# Patient Record
Sex: Female | Born: 1995 | Race: Black or African American | Hispanic: No | Marital: Single | Smoking: Never smoker
Health system: Southern US, Community
[De-identification: ages and names within clinical notes are randomized; demographics above are authoritative.]

## PROBLEM LIST (undated history)

## (undated) DIAGNOSIS — F329 Major depressive disorder, single episode, unspecified: Secondary | ICD-10-CM

## (undated) DIAGNOSIS — D649 Anemia, unspecified: Secondary | ICD-10-CM

## (undated) DIAGNOSIS — F32A Depression, unspecified: Secondary | ICD-10-CM

## (undated) DIAGNOSIS — F419 Anxiety disorder, unspecified: Secondary | ICD-10-CM

## (undated) DIAGNOSIS — F319 Bipolar disorder, unspecified: Secondary | ICD-10-CM

---

## 2017-01-02 ENCOUNTER — Encounter (HOSPITAL_COMMUNITY): Payer: Self-pay | Admitting: Emergency Medicine

## 2017-01-02 ENCOUNTER — Emergency Department (HOSPITAL_COMMUNITY)
Admission: EM | Admit: 2017-01-02 | Discharge: 2017-01-02 | Disposition: A | Discharge status: Home / Self Care | Payer: 59 | Attending: Emergency Medicine | Admitting: Emergency Medicine

## 2017-01-02 DIAGNOSIS — R21 Rash and other nonspecific skin eruption: Secondary | ICD-10-CM | POA: Diagnosis not present

## 2017-01-02 DIAGNOSIS — T781XXA Other adverse food reactions, not elsewhere classified, initial encounter: Secondary | ICD-10-CM | POA: Diagnosis not present

## 2017-01-02 DIAGNOSIS — Z79899 Other long term (current) drug therapy: Secondary | ICD-10-CM | POA: Diagnosis not present

## 2017-01-02 HISTORY — DX: Anxiety disorder, unspecified: F41.9

## 2017-01-02 HISTORY — DX: Bipolar disorder, unspecified: F31.9

## 2017-01-02 HISTORY — DX: Anemia, unspecified: D64.9

## 2017-01-02 HISTORY — DX: Major depressive disorder, single episode, unspecified: F32.9

## 2017-01-02 HISTORY — DX: Depression, unspecified: F32.A

## 2017-01-02 MED ORDER — PREDNISONE 10 MG (21) PO TBPK: ORAL_TABLET | ORAL | 0 refills | Status: DC

## 2017-01-02 MED ORDER — SODIUM CHLORIDE 0.9 % IV BOLUS (SEPSIS)
1000.0000 mL | Freq: Once | INTRAVENOUS | Status: AC
  Administered 2017-01-02: 1000 mL via INTRAVENOUS

## 2017-01-02 MED ORDER — IPRATROPIUM-ALBUTEROL 0.5-2.5 (3) MG/3ML IN SOLN
3.0000 mL | Freq: Once | RESPIRATORY_TRACT | Status: AC
  Administered 2017-01-02: 3 mL via RESPIRATORY_TRACT
  Filled 2017-01-02: qty 3

## 2017-01-02 MED ORDER — FAMOTIDINE IN NACL 20-0.9 MG/50ML-% IV SOLN
20.0000 mg | Freq: Once | INTRAVENOUS | Status: AC
Start: 2017-01-02 — End: 2017-01-02
  Administered 2017-01-02: 20 mg via INTRAVENOUS
  Filled 2017-01-02: qty 50

## 2017-01-02 MED ORDER — ONDANSETRON HCL 4 MG/2ML IJ SOLN
4.0000 mg | Freq: Once | INTRAMUSCULAR | Status: DC
  Filled 2017-01-02: qty 2

## 2017-01-02 MED ORDER — METHYLPREDNISOLONE SODIUM SUCC 125 MG IJ SOLR
125.0000 mg | Freq: Once | INTRAMUSCULAR | Status: AC
  Administered 2017-01-02: 125 mg via INTRAVENOUS
  Filled 2017-01-02: qty 2

## 2017-01-02 MED ORDER — DIPHENHYDRAMINE HCL 50 MG/ML IJ SOLN
25.0000 mg | Freq: Once | INTRAMUSCULAR | Status: AC
  Administered 2017-01-02: 25 mg via INTRAVENOUS
  Filled 2017-01-02: qty 1

## 2017-01-02 NOTE — Discharge Instructions (Signed)
Stop current prednisone dose.  Start new dose.  OTC Eucerin cream for dry skin.

## 2017-01-02 NOTE — ED Provider Notes (Signed)
[facility]-EMERGENCY DEPT Provider Note   CSN: [id number] Arrival date & time: 01/02/17  1653     History   Chief Complaint Chief Complaint  Patient presents with  . Allergic Reaction    HPI Tracie Stevens is a [age] y.o. female.  Pt presents to the ED today with allergy sx.  She has a hx of peanut allergies and accidentally ate peanuts on Friday the 23rd.  [facility]one (20 mg) and a rx for epi pen.  Pt felt worse today.  She was too nervous to take the epi pen, so she came to the ED.  Pt c/o itching throat swelling.      Past Medical History:  Diagnosis Date  . Anemia   . Anxiety   . Bipolar 1 disorder (HCC)   . Depression     There are no active problems to display for this patient.   History reviewed. No pertinent surgical history.  OB History    No data available       Home Medications    Prior to Admission medications   Medication Sig Start Date End Date Taking? Authorizing Provider  ARIPiprazole (ABILIFY) 2 MG tablet Take 2 mg by mouth daily. 12/28/16  Yes Historical Provider, MD  diphenhydrAMINE (BENADRYL) 25 mg capsule Take 25 mg by mouth every 6 (six) hours as needed for allergies.   Yes Historical Provider, MD  EPINEPHrine 0.3 mg/0.3 mL IJ SOAJ injection Inject 0.3 mg into the muscle as needed. 01/02/17  Yes Historical Provider, MD  hydrOXYzine (ATARAX/VISTARIL) 50 MG tablet Take 25 mg by mouth daily as needed.  10/06/16  Yes Historical Provider, MD  PRAZOSIN HCL PO Take 1 tablet by mouth at bedtime.   Yes Historical Provider, MD  VENTOLIN HFA 108 (90 Base) MCG/ACT inhaler Take 2 puffs by mouth as needed. 12/14/16  Yes Historical Provider, MD  VIIBRYD 40 MG TABS Take 40 mg by mouth at bedtime. 12/06/16  Yes Historical Provider, MD  YASMIN 28 3-0.03 MG tablet Take 1 tablet by mouth daily. 10/12/16  Yes Historical Provider, MD  predniSONE (STERAPRED UNI-PAK 21 TAB) 10 MG (21) TBPK tablet Take 6 tabs by mouth daily  for  2 days, then 5 tabs for 2 days, then 4 tabs for 2 days, then 3 tabs for 2 days, 2 tabs for 2 days, then 1 tab by mouth daily for 2 days 01/02/17   Jacalyn LefevreJulie Arthea Nobel, MD    Family History History reviewed. No pertinent family history.  Social History Social History  Substance Use Topics  . Smoking status: Never Smoker  . Smokeless tobacco: Never Used  . Alcohol use No     Allergies   Other   Review of Systems Review of Systems  Skin: Positive for rash.  Allergic/Immunologic: Positive for food allergies.  All other systems reviewed and are negative.    Physical Exam Updated Vital Signs BP 125/63   Pulse 112   Temp 98.8 F (37.1 C) (Oral)   Resp 24   Ht 5\' 4"  (1.626 m)   Wt 160 lb (72.6 kg)   SpO2 100%   BMI 27.46 kg/m   Physical Exam  Constitutional: She is oriented to person, place, and time. She appears well-developed and well-nourished.  HENT:  Head: Normocephalic and atraumatic.  Right Ear: External ear normal.  Left Ear: External ear normal.  Nose: Nose normal.  Mouth/Throat: Oropharynx is clear and moist.  Eyes: EOM are normal.  Pupils are equal, round, and reactive to light.  Neck: Normal range of motion. Neck supple.  Cardiovascular: Regular rhythm, normal heart sounds and intact distal pulses.  Tachycardia present.   Pulmonary/Chest: Effort normal and breath sounds normal.  Abdominal: Soft. Bowel sounds are normal.  Musculoskeletal: Normal range of motion.  Neurological: She is alert and oriented to person, place, and time.  Skin: Rash noted.  Psychiatric: She has a normal mood and affect. Her behavior is normal. Judgment and thought content normal.  Nursing note and vitals reviewed.    ED Treatments / Results  Labs (all labs ordered are listed, but only abnormal results are displayed) Labs Reviewed - No data to display  EKG  EKG Interpretation None       Radiology No results found.  Procedures Procedures (including critical care  time)  Medications Ordered in ED Medications  ondansetron (ZOFRAN) injection 4 mg (0 mg Intravenous Hold 01/02/17 1945)  ipratropium-albuterol (DUONEB) 0.5-2.5 (3) MG/3ML nebulizer solution 3 mL (3 mLs Nebulization Given 01/02/17 1900)  methylPREDNISolone sodium succinate (SOLU-MEDROL) 125 mg/2 mL injection 125 mg (125 mg Intravenous Given 01/02/17 1900)  diphenhydrAMINE (BENADRYL) injection 25 mg (25 mg Intravenous Given 01/02/17 1900)  famotidine (PEPCID) IVPB 20 mg premix (0 mg Intravenous Stopped 01/02/17 1959)  sodium chloride 0.9 % bolus 1,000 mL (0 mLs Intravenous Stopped 01/02/17 2127)     Initial Impression / Assessment and Plan / ED Course  I have reviewed the triage vital signs and the nursing notes.  Pertinent labs & imaging results that were available during my care of the patient were reviewed by me and considered in my medical decision making (see chart for details).    Pt feels much better and rash is gone after treatment with solumedrol, pepcid, and benadryl.  Pt d/c with a stronger dose and longer taper of prednisone.  Pt does have her epi pen.  She knows to return if worse.   Final Clinical Impressions(s) / ED Diagnoses   Final diagnoses:  Allergic reaction to food, initial encounter    New Prescriptions Discharge Medication List as of 01/02/2017  9:30 PM    START taking these medications   Details  predniSONE (STERAPRED UNI-PAK 21 TAB) 10 MG (21) TBPK tablet Take 6 tabs by mouth daily  for 2 days, then 5 tabs for 2 days, then 4 tabs for 2 days, then 3 tabs for 2 days, 2 tabs for 2 days, then 1 tab by mouth daily for 2 days, Print         Jacalyn Lefevre, MD 01/02/17 2333

## 2017-01-02 NOTE — ED Notes (Signed)
Bed: WA22 Expected date:  Expected time:  Means of arrival:  Comments: Hold for triage 

## 2017-01-02 NOTE — ED Triage Notes (Signed)
Pt reports she had an allergic reaction on Friday to peanuts. Pt reports she began to feel worse today. [facility] Pt took and nap and when she woke up she noticed worsening SOB and abd pain. Pt feels that throat is swollen.

## 2017-01-03 DIAGNOSIS — R109 Unspecified abdominal pain: Secondary | ICD-10-CM | POA: Diagnosis present

## 2017-01-03 DIAGNOSIS — T380X5A Adverse effect of glucocorticoids and synthetic analogues, initial encounter: Secondary | ICD-10-CM | POA: Insufficient documentation

## 2017-01-03 DIAGNOSIS — Z79899 Other long term (current) drug therapy: Secondary | ICD-10-CM | POA: Diagnosis not present

## 2017-01-03 DIAGNOSIS — F419 Anxiety disorder, unspecified: Secondary | ICD-10-CM | POA: Diagnosis not present

## 2017-01-03 DIAGNOSIS — K2970 Gastritis, unspecified, without bleeding: Secondary | ICD-10-CM | POA: Insufficient documentation

## 2017-01-03 NOTE — ED Triage Notes (Signed)
Pt states that this afternoon she started feeling like she was manic and tried to take her meds but feels more anxious now. Pt is also taking prednisone for an allergic reaction to peanuts yesterday. Pt states now she feels depressed and sad. Alert and oriented.Denies SI/HI.

## 2017-01-04 ENCOUNTER — Emergency Department (HOSPITAL_COMMUNITY): Payer: 59

## 2017-01-04 ENCOUNTER — Encounter: Payer: Self-pay | Admitting: Emergency Medicine

## 2017-01-04 ENCOUNTER — Emergency Department (HOSPITAL_COMMUNITY)
Admission: EM | Admit: 2017-01-04 | Discharge: 2017-01-04 | Disposition: A | Discharge status: Home / Self Care | Payer: 59 | Attending: Emergency Medicine | Admitting: Emergency Medicine

## 2017-01-04 DIAGNOSIS — K2970 Gastritis, unspecified, without bleeding: Secondary | ICD-10-CM

## 2017-01-04 DIAGNOSIS — R1013 Epigastric pain: Secondary | ICD-10-CM

## 2017-01-04 DIAGNOSIS — R0789 Other chest pain: Secondary | ICD-10-CM

## 2017-01-04 DIAGNOSIS — F419 Anxiety disorder, unspecified: Secondary | ICD-10-CM

## 2017-01-04 DIAGNOSIS — R112 Nausea with vomiting, unspecified: Secondary | ICD-10-CM

## 2017-01-04 DIAGNOSIS — T50905A Adverse effect of unspecified drugs, medicaments and biological substances, initial encounter: Secondary | ICD-10-CM

## 2017-01-04 LAB — URINALYSIS, ROUTINE W REFLEX MICROSCOPIC
BILIRUBIN URINE: NEGATIVE
GLUCOSE, UA: NEGATIVE mg/dL
HGB URINE DIPSTICK: NEGATIVE
Ketones, ur: NEGATIVE mg/dL
Leukocytes, UA: NEGATIVE
Nitrite: NEGATIVE
PROTEIN: NEGATIVE mg/dL
Specific Gravity, Urine: 1.013 (ref 1.005–1.030)
pH: 6 (ref 5.0–8.0)

## 2017-01-04 LAB — COMPREHENSIVE METABOLIC PANEL
ALT: 32 U/L (ref 14–54)
ANION GAP: 9 (ref 5–15)
AST: 34 U/L (ref 15–41)
Albumin: 4.4 g/dL (ref 3.5–5.0)
Alkaline Phosphatase: 76 U/L (ref 38–126)
BILIRUBIN TOTAL: 0.5 mg/dL (ref 0.3–1.2)
BUN: 15 mg/dL (ref 6–20)
CO2: 22 mmol/L (ref 22–32)
Calcium: 9.2 mg/dL (ref 8.9–10.3)
Chloride: 107 mmol/L (ref 101–111)
Creatinine, Ser: 0.91 mg/dL (ref 0.44–1.00)
GFR calc Af Amer: >60 mL/min (ref >60)
Glucose, Bld: 126 mg/dL — ABNORMAL HIGH (ref 65–99)
POTASSIUM: 4.6 mmol/L (ref 3.5–5.1)
Sodium: 138 mmol/L (ref 135–145)
TOTAL PROTEIN: 7.7 g/dL (ref 6.5–8.1)

## 2017-01-04 LAB — CBC WITH DIFFERENTIAL/PLATELET
BASOS ABS: 0 10*3/uL (ref 0.0–0.1)
Basophils Relative: 0 %
Eosinophils Absolute: 0 10*3/uL (ref 0.0–0.7)
Eosinophils Relative: 0 %
HEMATOCRIT: 37.5 % (ref 36.0–46.0)
Hemoglobin: 12.8 g/dL (ref 12.0–15.0)
LYMPHS ABS: 1.2 10*3/uL (ref 0.7–4.0)
Lymphocytes Relative: 8 %
MCH: 28.9 pg (ref 26.0–34.0)
MCHC: 34.1 g/dL (ref 30.0–36.0)
MCV: 84.7 fL (ref 78.0–100.0)
MONOS PCT: 4 %
Monocytes Absolute: 0.6 10*3/uL (ref 0.1–1.0)
NEUTROS ABS: 13.2 10*3/uL — AB (ref 1.7–7.7)
Neutrophils Relative %: 88 %
Platelets: 381 10*3/uL (ref 150–400)
RBC: 4.43 MIL/uL (ref 3.87–5.11)
RDW: 12.9 % (ref 11.5–15.5)
WBC: 15 10*3/uL — ABNORMAL HIGH (ref 4.0–10.5)

## 2017-01-04 LAB — I-STAT BETA HCG BLOOD, ED (MC, WL, AP ONLY)

## 2017-01-04 LAB — LIPASE, BLOOD: LIPASE: 19 U/L (ref 11–51)

## 2017-01-04 LAB — I-STAT TROPONIN, ED: Troponin i, poc: 0 ng/mL (ref 0.00–0.08)

## 2017-01-04 MED ORDER — SODIUM CHLORIDE 0.9 % IV BOLUS (SEPSIS)
1000.0000 mL | Freq: Once | INTRAVENOUS | Status: AC
  Administered 2017-01-04: 1000 mL via INTRAVENOUS

## 2017-01-04 MED ORDER — ONDANSETRON 4 MG PO TBDP: 4.0000 mg | ORAL_TABLET | Freq: Three times a day (TID) | ORAL | 0 refills | Status: DC | PRN

## 2017-01-04 MED ORDER — ONDANSETRON HCL 4 MG/2ML IJ SOLN
4.0000 mg | Freq: Once | INTRAMUSCULAR | Status: AC
  Administered 2017-01-04: 4 mg via INTRAVENOUS
  Filled 2017-01-04: qty 2

## 2017-01-04 MED ORDER — GI COCKTAIL ~~LOC~~
30.0000 mL | Freq: Once | ORAL | Status: AC
  Administered 2017-01-04: 30 mL via ORAL
  Filled 2017-01-04: qty 30

## 2017-01-04 NOTE — ED Notes (Signed)
Asked for urine  

## 2017-01-04 NOTE — ED Provider Notes (Signed)
[facility]-EMERGENCY DEPT Provider Note   CSN: [id number] Arrival date & time: 01/03/17  2350  By signing my name below, I, Rosario Adie, attest that this documentation has been prepared under the direction and in the presence of [address], [employer]. Electronically Signed: Rosario Adie, ED Scribe. 01/04/17. 1:25 AM.  History   Chief Complaint Chief Complaint  Patient presents with  . Anxiety   The history is provided by the patient and medical records. No language interpreter was used.  Anxiety  This is a recurrent problem. The current episode started 3 to 5 hours ago. The problem occurs constantly. The problem has been gradually worsening. Associated symptoms include abdominal pain. Pertinent negatives include no chest pain and no shortness of breath. Exacerbated by: Prednisone. Nothing relieves the symptoms. She has tried nothing for the symptoms. The treatment provided no relief.    HPI Comments: Tracie Stevens is a 21 y.o. female brought in by EMS, with a PMHx of anxiety, Bipolar 1 disorder, and depression, who presents to the Emergency Department complaining of gradually worsening sensation of "feeling manic" beginning four hours ago. Per prior chart review, pt was seen in the ED yesterday for a mild allergic reaction. She was given a prescription for Prednisone at that time, and she states that she took her first dose of it earlier this afternoon at [facility]; reports that she was already feeling somewhat manic at the time, secondary to her bipolar disorder. She states that following taking her prednisone, she started feeling worse manic symptoms; proceeded with taking her nightly Abilify and Viibryd however this didn't help her manic feelings and so she became more anxious and called EMS. She describes her symptoms as sensation of jitteriness and feeling energetic and accelerated. Pt also notes associated gradual onset epigastric abdominal pain since awakening this morning, which she  originally thought was because she hadn't eaten and was hungry; however she continues to feel it, and states she didn't really eat much today even though she thought she needed to eat. She describes the pain as 5/10, intermittent sharp epigastric pain with radiation into her right lateral chest, worsened with moving, and with no tx tried PTA. She also reports she had three episodes of non-bloody, non-bilious vomiting earlier. She denies any personal hx of PE/DVT, recent long travel, surgery, prolonged immobilization, or any estrogen/hormone use. She denies SI/HI, auditory/visual hallucinations, intentional overdoses or self-injury, illicit drug or alcohol usage. She also denies diaphoresis, lightheadedness, fevers, chills, SOB, cough, LE swelling, claudication, orthopnea, diarrhea, constipation, obstipation, melena, hematochezia, hematemesis, hematuria, dysuria, vaginal bleeding/discharge, myalgias, arthralgias, numbness, tingling, focal weakness, or any other complaints at this time. No family hx of cardiac disease. Nonsmoker.   Past Medical History:  Diagnosis Date  . Anemia   . Anxiety   . Bipolar 1 disorder (HCC)   . Depression    There are no active problems to display for this patient.  No past surgical history on file.  OB History    No data available       Home Medications    Prior to Admission medications   Medication Sig Start Date End Date Taking? Authorizing Provider  ARIPiprazole (ABILIFY) 2 MG tablet Take 2 mg by mouth daily. 12/28/16   Historical Provider, MD  diphenhydrAMINE (BENADRYL) 25 mg capsule Take 25 mg by mouth every 6 (six) hours as needed for allergies.    Historical Provider, MD  EPINEPHrine 0.3 mg/0.3 mL IJ SOAJ injection Inject 0.3 mg into the muscle as needed. 01/02/17  Historical Provider, MD  hydrOXYzine (ATARAX/VISTARIL) 50 MG tablet Take 25 mg by mouth daily as needed.  10/06/16   Historical Provider, MD  PRAZOSIN HCL PO Take 1 tablet by mouth at bedtime.     Historical Provider, MD  predniSONE (STERAPRED UNI-PAK 21 TAB) 10 MG (21) TBPK tablet Take 6 tabs by mouth daily  for 2 days, then 5 tabs for 2 days, then 4 tabs for 2 days, then 3 tabs for 2 days, 2 tabs for 2 days, then 1 tab by mouth daily for 2 days 01/02/17   Jacalyn Lefevre, MD  VENTOLIN HFA 108 [phone] Base) MCG/ACT inhaler Take 2 puffs by mouth as needed. 12/14/16   Historical Provider, MD  VIIBRYD 40 MG TABS Take 40 mg by mouth at bedtime. 12/06/16   Historical Provider, MD  YASMIN 28 3-0.03 MG tablet Take 1 tablet by mouth daily. 10/12/16   Historical Provider, MD    Family History No family history on file.  Social History Social History  Substance Use Topics  . Smoking status: Never Smoker  . Smokeless tobacco: Never Used  . Alcohol use No   Allergies   Other and Peach flavor  Review of Systems Review of Systems  Constitutional: Negative for chills, diaphoresis and fever.  Respiratory: Negative for cough and shortness of breath.   Cardiovascular: Negative for chest pain and leg swelling.  Gastrointestinal: Positive for abdominal pain, nausea and vomiting. Negative for blood in stool, constipation and diarrhea.  Genitourinary: Negative for dysuria, hematuria, vaginal bleeding and vaginal discharge.  Musculoskeletal: Negative for arthralgias and myalgias.  Skin: Negative for color change.  Allergic/Immunologic: Negative for immunocompromised state.  Neurological: Negative for weakness, light-headedness and numbness.  Psychiatric/Behavioral: Negative for confusion, self-injury and suicidal ideas. The patient is nervous/anxious.        +mania, No HI.   A complete 10 system review of systems was obtained and all systems are negative except as noted in the HPI and PMH.   Physical Exam Updated Vital Signs BP 117/93 (BP Location: Right Arm)   Pulse 93   Temp 98.4 F (36.9 C) (Oral)   Resp 20   LMP 01/04/2017 (Approximate)   SpO2 97%   Physical Exam  Constitutional: She is  oriented to person, place, and time. Vital signs are normal. She appears well-developed and well-nourished.  Non-toxic appearance. She appears distressed (anxious).  Afebrile, nontoxic, moderately anxious appearing.   HENT:  Head: Normocephalic and atraumatic.  Mouth/Throat: Oropharynx is clear and moist and mucous membranes are normal.  Eyes: Conjunctivae and EOM are normal. Right eye exhibits no discharge. Left eye exhibits no discharge.  Neck: Normal range of motion. Neck supple.  Cardiovascular: Normal rate, regular rhythm, normal heart sounds and intact distal pulses.  Exam reveals no gallop and no friction rub.   No murmur heard. RRR, nl s1/s2, no m/r/g, distal pulses intact, no pedal edema  Pulmonary/Chest: Effort normal and breath sounds normal. No respiratory distress. She has no decreased breath sounds. She has no wheezes. She has no rhonchi. She has no rales. She exhibits tenderness. She exhibits no crepitus, no deformity and no retraction.  CTAB in all lung fields, no w/r/r, no hypoxia or increased WOB, speaking in full sentences, SpO2 97% on RA Chest wall with mild epigastric/subxyphoid TTP without crepitus, deformities, or retractions   Abdominal: Soft. Normal appearance and bowel sounds are normal. She exhibits no distension. There is tenderness in the epigastric area. There is no rigidity, no rebound, no guarding,  no CVA tenderness, no tenderness at McBurney's point and negative Murphy's sign.  Soft, non-distended, +BS throughout, with mild epigastric TTP, no r/g/r, neg murphy's, neg mcburney's, no CVA TTP  Musculoskeletal: Normal range of motion.  MAE x4 Strength and sensation grossly intact in all extremities Distal pulses intact Gait steady No pedal edema, neg homan's bilaterally  Neurological: She is alert and oriented to person, place, and time. She has normal strength. No sensory deficit.  Skin: Skin is warm, dry and intact. No rash noted.  Psychiatric: Her mood appears  anxious. Her speech is rapid and/or pressured. She is not actively hallucinating. She expresses no homicidal and no suicidal ideation. She expresses no suicidal plans and no homicidal plans.  Anxious appearing, somewhat rapid and pressured speech, denies SI/HI/AVH.   Nursing note and vitals reviewed.  ED Treatments / Results  DIAGNOSTIC STUDIES: Oxygen Saturation is 97% on RA, normal by my interpretation.   COORDINATION OF CARE: 1:49 AM-Discussed next steps with pt. Pt verbalized understanding and is agreeable with the plan.   Labs (all labs ordered are listed, but only abnormal results are displayed) Labs Reviewed  CBC WITH DIFFERENTIAL/PLATELET - Abnormal; Notable for the following:       Result Value   WBC 15.0 (*)    Neutro Abs 13.2 (*)    All other components within normal limits  COMPREHENSIVE METABOLIC PANEL - Abnormal; Notable for the following:    Glucose, Bld 126 (*)    All other components within normal limits  URINALYSIS, ROUTINE W REFLEX MICROSCOPIC - Abnormal; Notable for the following:    Color, Urine STRAW (*)    All other components within normal limits  LIPASE, BLOOD  I-STAT TROPOININ, ED  I-STAT BETA HCG BLOOD, ED ([facility], [facility], [facility] ONLY)    EKG  EKG Interpretation  Date/Time:  Wednesday January 04 2017 02:39:46 EST Ventricular Rate:  92 PR Interval:    QRS Duration: 80 QT Interval:  357 QTC Calculation: 442 R Axis:   66 Text Interpretation:  Sinus rhythm No old tracing to compare Confirmed by Erroll Luna [phone]) on 01/04/2017 4:15:29 AM      Radiology Dg Chest 2 View  Result Date: 01/04/2017 CLINICAL DATA:  Chest pain. EXAM: CHEST  2 VIEW COMPARISON:  None. FINDINGS: The cardiomediastinal contours are normal. The lungs are clear. Pulmonary vasculature is normal. No consolidation, pleural effusion, or pneumothorax. No acute osseous abnormalities are seen. IMPRESSION: No active cardiopulmonary disease. Electronically Signed   By: Rubye Oaks  M.D.   On: 01/04/2017 02:53    Procedures Procedures   Medications Ordered in ED Medications  sodium chloride 0.9 % bolus 1,000 mL (0 mLs Intravenous Stopped 01/04/17 0348)  gi cocktail (Maalox,Lidocaine,Donnatal) (30 mLs Oral Given 01/04/17 0221)  ondansetron (ZOFRAN) injection 4 mg (4 mg Intravenous Given 01/04/17 0222)    Initial Impression / Assessment and Plan / ED Course  I have reviewed the triage vital signs and the nursing notes.  Pertinent labs & imaging results that were available during my care of the patient were reviewed by me and considered in my medical decision making (see chart for details).     21 y.o. female here with anxiety/feeling manic after taking her prednisone at 4pm; states she was already feeling manic before that, has bipolar so she states this is somewhat normal for her; and after prednisone it amplified her symptoms. She reports she developed some epigastric pain that radiates into her chest which she attributed to not eating  well today; also had some n/v. On exam, appears moderately anxious, however VSS and otherwise in NAD; mild epigastric TTP which reproduces the CP she states she's also having; nonperitoneal and neg murphy's. No LE swelling, tachycardia, hypoxia, and PERC neg. Doubt PE, dissection, or other emergent cardiopulmonary etiologies of her symptoms. Overall her symptoms seem consistent with side effects of prednisone, especially since she took it in the afternoon which isn't ideal. Will get CBC w/diff, CMP, lipase, U/A, betaHCG, EKG, troponin, and CXR; will give GI cocktail, fluids, and zofran then reassess shortly.   6:03 AM CBC w/diff mildly elevated leukocytosis however differential unremarkable, could be stress demargination. CMP WNL aside from gluc 126. Lipase WNL. BetaHCG neg. U/A unremarkable. Trop neg. EKG unremarkable with no acute ischemic findings. CXR negative. Pt feeling much better, symptoms resolved, and tolerating PO well. Symptoms  likely due to prednisone side effects, coupled with bipolar disorder related mania; however pt not acutely psychotic and does not appear to be a threat to herself or others; low HEART score, and chest pain is likely more GI related since it's mostly in the epigastric region. Advised that she take prednisone with breakfast, not later, to lessen the side effects. Take usual home meds. Doubt need for psych eval given that she doesn't represent a threat to herself or others. Use tylenol/motrin for pain, rx zofran given. Advised tums/maalox PRN for additional relief, and GERD/gastritis diet modifications advised. F/up with PCP in 1wk for recheck of symptoms and ongoing management of her symptoms and psych conditions. I explained the diagnosis and have given explicit precautions to return to the ER including for any other new or worsening symptoms. The patient understands and accepts the medical plan as it's been dictated and I have answered their questions. Discharge instructions concerning home care and prescriptions have been given. The patient is STABLE and is discharged to home in good condition.   I personally performed the services described in this documentation, which was scribed in my presence. The recorded information has been reviewed and is accurate.   Final Clinical Impressions(s) / ED Diagnoses   Final diagnoses:  Anxiety  Atypical chest pain  Epigastric abdominal pain  Gastritis, presence of bleeding unspecified, unspecified chronicity, unspecified gastritis type  Nausea and vomiting in adult patient  Adverse effect of drug, initial encounter   New Prescriptions New Prescriptions   ONDANSETRON (ZOFRAN ODT) 4 MG DISINTEGRATING TABLET    Take 1 tablet (4 mg total) by mouth every 8 (eight) hours as needed for nausea or vomiting.     [address]Canary Fister, PA-C 01/04/17 [id number]    Tomasita CrumbleAdeleke Oni, MD 01/04/17 [phone]

## 2017-01-04 NOTE — Discharge Instructions (Signed)
Your abdominal/chest pain is likely from gastritis or an ulcer, or could be related to stress/anxiety. Your manic symptoms were likely worsened by taking prednisone in the afternoon; you should ALWAYS take it with BREAKFAST in order to lessen the side effects. You can consider trying to use over the counter zantac as needed for symptom relief, and avoid spicy/fatty/acidic foods, avoid soda/coffee/tea/alcohol. Avoid laying down flat within 30 minutes of eating. Avoid NSAIDs like ibuprofen/aleve/motrin/etc on an empty stomach. May consider using over the counter tums/maalox as needed for additional relief. Use zofran as directed as needed for nausea. Use tylenol as needed for pain. Follow up with your regular doctor in one week for ongoing evaluation and recheck of your symptoms and management of your bipolar disorder. Return to the ER for changes or worsening symptoms.  Abdominal (belly) pain can be caused by many things. Your caregiver performed an examination and possibly ordered blood/urine tests and imaging (CT scan, x-rays, ultrasound). Many cases can be observed and treated at home after initial evaluation in the emergency department. Even though you are being discharged home, abdominal pain can be unpredictable. Therefore, you need a repeated exam if your pain does not resolve, returns, or worsens. Most patients with abdominal pain don't have to be admitted to the hospital or have surgery, but serious problems like appendicitis and gallbladder attacks can start out as nonspecific pain. Many abdominal conditions cannot be diagnosed in one visit, so follow-up evaluations are very important. SEEK IMMEDIATE MEDICAL ATTENTION IF YOU DEVELOP ANY OF THE FOLLOWING SYMPTOMS: The pain does not go away or becomes severe.  A temperature above 101 develops.  Repeated vomiting occurs (multiple episodes).  The pain becomes localized to portions of the abdomen. The right side could possibly be appendicitis. In an  adult, the left lower portion of the abdomen could be colitis or diverticulitis.  Blood is being passed in stools or vomit (bright red or black tarry stools).  Return also if you develop chest pain, difficulty breathing, dizziness or fainting, or become confused, poorly responsive, or inconsolable (young children). The constipation stays for more than 4 days.  There is belly (abdominal) or rectal pain.  You do not seem to be getting better.

## 2017-01-04 NOTE — ED Notes (Signed)
Pt in xray

## 2017-02-11 ENCOUNTER — Emergency Department (HOSPITAL_COMMUNITY)
Admission: EM | Admit: 2017-02-11 | Discharge: 2017-02-11 | Disposition: A | Discharge status: Home / Self Care | Payer: 59 | Attending: Emergency Medicine | Admitting: Emergency Medicine

## 2017-02-11 ENCOUNTER — Emergency Department (HOSPITAL_COMMUNITY): Payer: 59

## 2017-02-11 ENCOUNTER — Encounter (HOSPITAL_COMMUNITY): Payer: Self-pay

## 2017-02-11 DIAGNOSIS — Z79899 Other long term (current) drug therapy: Secondary | ICD-10-CM | POA: Diagnosis not present

## 2017-02-11 DIAGNOSIS — F419 Anxiety disorder, unspecified: Secondary | ICD-10-CM | POA: Diagnosis not present

## 2017-02-11 DIAGNOSIS — R000 Tachycardia, unspecified: Secondary | ICD-10-CM | POA: Diagnosis not present

## 2017-02-11 DIAGNOSIS — F410 Panic disorder [episodic paroxysmal anxiety] without agoraphobia: Secondary | ICD-10-CM | POA: Diagnosis present

## 2017-02-11 LAB — RAPID URINE DRUG SCREEN, HOSP PERFORMED
Amphetamines: NOT DETECTED
Barbiturates: NOT DETECTED
Benzodiazepines: NOT DETECTED
Cocaine: NOT DETECTED
OPIATES: NOT DETECTED
Tetrahydrocannabinol: POSITIVE — AB

## 2017-02-11 LAB — CBC WITH DIFFERENTIAL/PLATELET
BASOS PCT: 0 %
Basophils Absolute: 0 10*3/uL (ref 0.0–0.1)
EOS ABS: 0 10*3/uL (ref 0.0–0.7)
EOS PCT: 0 %
HCT: 34.5 % — ABNORMAL LOW (ref 36.0–46.0)
Hemoglobin: 11.8 g/dL — ABNORMAL LOW (ref 12.0–15.0)
LYMPHS ABS: 1 10*3/uL (ref 0.7–4.0)
Lymphocytes Relative: 9 %
MCH: 29.5 pg (ref 26.0–34.0)
MCHC: 34.2 g/dL (ref 30.0–36.0)
MCV: 86.3 fL (ref 78.0–100.0)
MONO ABS: 0.5 10*3/uL (ref 0.1–1.0)
Monocytes Relative: 4 %
Neutro Abs: 10.1 10*3/uL — ABNORMAL HIGH (ref 1.7–7.7)
Neutrophils Relative %: 87 %
Platelets: 289 10*3/uL (ref 150–400)
RBC: 4 MIL/uL (ref 3.87–5.11)
RDW: 13 % (ref 11.5–15.5)
WBC: 11.6 10*3/uL — ABNORMAL HIGH (ref 4.0–10.5)

## 2017-02-11 LAB — I-STAT CHEM 8, ED
BUN: 9 mg/dL (ref 6–20)
CREATININE: 0.7 mg/dL (ref 0.44–1.00)
Calcium, Ion: 1.1 mmol/L — ABNORMAL LOW (ref 1.15–1.40)
Chloride: 102 mmol/L (ref 101–111)
Glucose, Bld: 209 mg/dL — ABNORMAL HIGH (ref 65–99)
HEMATOCRIT: 38 % (ref 36.0–46.0)
HEMOGLOBIN: 12.9 g/dL (ref 12.0–15.0)
POTASSIUM: 3.1 mmol/L — AB (ref 3.5–5.1)
SODIUM: 140 mmol/L (ref 135–145)
TCO2: 24 mmol/L (ref 0–100)

## 2017-02-11 LAB — TSH: TSH: 2.302 u[IU]/mL (ref 0.350–4.500)

## 2017-02-11 MED ORDER — IOPAMIDOL (ISOVUE-370) INJECTION 76%
INTRAVENOUS | Status: AC
  Filled 2017-02-11: qty 100

## 2017-02-11 MED ORDER — POTASSIUM CHLORIDE CRYS ER 20 MEQ PO TBCR
40.0000 meq | EXTENDED_RELEASE_TABLET | Freq: Once | ORAL | Status: AC
  Administered 2017-02-11: 40 meq via ORAL
  Filled 2017-02-11: qty 2

## 2017-02-11 MED ORDER — SODIUM CHLORIDE 0.9 % IV SOLN: INTRAVENOUS | Status: DC

## 2017-02-11 MED ORDER — LORAZEPAM 2 MG/ML IJ SOLN
1.0000 mg | Freq: Once | INTRAMUSCULAR | Status: AC
  Administered 2017-02-11: 1 mg via INTRAVENOUS
  Filled 2017-02-11: qty 1

## 2017-02-11 MED ORDER — SODIUM CHLORIDE 0.9 % IV BOLUS (SEPSIS)
2000.0000 mL | Freq: Once | INTRAVENOUS | Status: AC
  Administered 2017-02-11: 2000 mL via INTRAVENOUS

## 2017-02-11 MED ORDER — IOPAMIDOL (ISOVUE-370) INJECTION 76%
100.0000 mL | Freq: Once | INTRAVENOUS | Status: AC | PRN
  Administered 2017-02-11: 100 mL via INTRAVENOUS

## 2017-02-11 NOTE — ED Triage Notes (Addendum)
BIB EMS from [facility] reports sob after eating [employer], pt initially though it was an allergic reaction but now believes it was a panic attack r/t to the increase in her psychiatric medications. Pt denies pain. Pt denies SI/HI.

## 2017-02-11 NOTE — Discharge Instructions (Signed)
You were offered admission for evaluation of your anxiety as well as increased heart rate. You have deferred at this time and encouraged to return should you change your mind

## 2017-02-11 NOTE — ED Notes (Signed)
EKG given to EDP,Allen,MD., for pain.

## 2017-02-11 NOTE — ED Provider Notes (Signed)
[facility]-EMERGENCY DEPT Provider Note   CSN: [id number] Arrival date & time: 02/11/17  0407     History   Chief Complaint Chief Complaint  Patient presents with  . Panic Attack    HPI Tracie Stevens is a 21 y.o. female.  21 year old female presents after waking from her sleep with a night tear. Patient states that she has had increased anxiety over the last few days due to increased stress in her life. Does have a history of bipolar disorder. When she woke up she had nausea and had emesis 1. Denies any suicidal or homicidal ideations. Denies any recent history of volume loss. Denies illicit drug use. Called EMS and was transported here.      Past Medical History:  Diagnosis Date  . Anemia   . Anxiety   . Bipolar 1 disorder (HCC)   . Depression     There are no active problems to display for this patient.   History reviewed. No pertinent surgical history.  OB History    No data available       Home Medications    Prior to Admission medications   Medication Sig Start Date End Date Taking? Authorizing Provider  ARIPiprazole (ABILIFY) 2 MG tablet Take 2 mg by mouth daily. 12/28/16   Historical Provider, MD  diphenhydrAMINE (BENADRYL) 25 mg capsule Take 25 mg by mouth every 6 (six) hours as needed for allergies.    Historical Provider, MD  EPINEPHrine 0.3 mg/0.3 mL IJ SOAJ injection Inject 0.3 mg into the muscle as needed (allergic reaction).  01/02/17   Historical Provider, MD  hydrOXYzine (ATARAX/VISTARIL) 50 MG tablet Take 25 mg by mouth daily as needed for itching.  10/06/16   Historical Provider, MD  ondansetron (ZOFRAN ODT) 4 MG disintegrating tablet Take 1 tablet (4 mg total) by mouth every 8 (eight) hours as needed for nausea or vomiting. 01/04/17   Mercedes Street, PA-C  OVER THE COUNTER MEDICATION Take 1 tablet by mouth as directed. Before prednisone    Historical Provider, MD  PRAZOSIN HCL PO Take 1 mg by mouth at bedtime.     Historical Provider, MD  predniSONE  (STERAPRED UNI-PAK 21 TAB) 10 MG (21) TBPK tablet Take 6 tabs by mouth daily  for 2 days, then 5 tabs for 2 days, then 4 tabs for 2 days, then 3 tabs for 2 days, 2 tabs for 2 days, then 1 tab by mouth daily for 2 days 01/02/17   Jacalyn Lefevre, MD  VENTOLIN HFA 108 [id number] Base) MCG/ACT inhaler Take 2 puffs by mouth as needed for shortness of breath.  12/14/16   Historical Provider, MD  VIIBRYD 40 MG TABS Take 40 mg by mouth at bedtime. 12/06/16   Historical Provider, MD  YASMIN 28 3-0.03 MG tablet Take 1 tablet by mouth daily. 10/12/16   Historical Provider, MD    Family History History reviewed. No pertinent family history.  Social History Social History  Substance Use Topics  . Smoking status: Never Smoker  . Smokeless tobacco: Never Used  . Alcohol use No     Allergies   Other and Peach flavor   Review of Systems Review of Systems  All other systems reviewed and are negative.    Physical Exam Updated Vital Signs BP 133/79 (BP Location: Right Arm)   Pulse (!) 139   Temp 98.4 F (36.9 C) (Oral)   Resp 20   Ht  (1.626 m)   Wt 81.6 kg   LMP 02/10/2017  SpO2 100%   BMI 30.90 kg/m   Physical Exam  Constitutional: She is oriented to person, place, and time. She appears well-developed and well-nourished.  Non-toxic appearance. No distress.  HENT:  Head: Normocephalic and atraumatic.  Eyes: Conjunctivae, EOM and lids are normal. Pupils are equal, round, and reactive to light.  Neck: Normal range of motion. Neck supple. No tracheal deviation present. No thyroid mass present.  Cardiovascular: Regular rhythm and normal heart sounds.  Tachycardia present.  Exam reveals no gallop.   No murmur heard. Pulmonary/Chest: Effort normal and breath sounds normal. No stridor. No respiratory distress. She has no decreased breath sounds. She has no wheezes. She has no rhonchi. She has no rales.  Abdominal: Soft. Normal appearance and bowel sounds are normal. She exhibits no distension.  There is no tenderness. There is no rebound and no CVA tenderness.  Musculoskeletal: Normal range of motion. She exhibits no edema or tenderness.  Neurological: She is alert and oriented to person, place, and time. She has normal strength. No cranial nerve deficit or sensory deficit. GCS eye subscore is 4. GCS verbal subscore is 5. GCS motor subscore is 6.  Skin: Skin is warm and dry. No abrasion and no rash noted.  Psychiatric: Her speech is normal and behavior is normal. Her mood appears anxious.  Nursing note and vitals reviewed.    ED Treatments / Results  Labs (all labs ordered are listed, but only abnormal results are displayed) Labs Reviewed  I-STAT CHEM 8, ED    EKG  EKG Interpretation None       Radiology No results found.  Procedures Procedures (including critical care time)  Medications Ordered in ED Medications  0.9 %  sodium chloride infusion (not administered)  sodium chloride 0.9 % bolus 2,000 mL (not administered)  LORazepam (ATIVAN) injection 1 mg (not administered)     Initial Impression / Assessment and Plan / ED Course  I have reviewed the triage vital signs and the nursing notes.  Pertinent labs & imaging results that were available during my care of the patient were reviewed by me and considered in my medical decision making (see chart for details).     Patient given IV fluids as well as Ativan. She still remains tachycardic into the 130s and up to 150 when awake. She denies feeling anxious at this time. She relates that she did feel dyspneic when she first woke up and had some chest discomfort. Will obtain chest CT and other laboratory testing at this time.  6:49 AM Patient's chest CT negative for PE. Her TSH is within normal limits. Does have mild hypokalemia. That was treated with oral potassium. Her hemoglobin is stable. Patient is calm her heart rate goes down to the 110s but when speaking and somewhat anxious to goes up to the 130s to 140s. I  offered her admission for further evaluation of this but she has deferred. I feel that this is likely anxiety given her current workup. She denies any chest pain. At this time. Return precautions given.  Final Clinical Impressions(s) / ED Diagnoses   Final diagnoses:  None    New Prescriptions New Prescriptions   No medications on file     Lorre Nick, MD 02/11/17 [phone]

## 2017-02-18 ENCOUNTER — Ambulatory Visit: Payer: Self-pay

## 2017-03-10 ENCOUNTER — Encounter: Payer: Self-pay | Admitting: Family Medicine

## 2017-03-10 ENCOUNTER — Ambulatory Visit (INDEPENDENT_AMBULATORY_CARE_PROVIDER_SITE_OTHER): Payer: 59 | Admitting: Family Medicine

## 2017-03-10 VITALS — BP 108/70 | HR 98 | Temp 98.3°F | Resp 18 | Ht 63.75 in | Wt 168.0 lb

## 2017-03-10 DIAGNOSIS — Z0000 Encounter for general adult medical examination without abnormal findings: Secondary | ICD-10-CM

## 2017-03-10 DIAGNOSIS — D500 Iron deficiency anemia secondary to blood loss (chronic): Secondary | ICD-10-CM | POA: Diagnosis not present

## 2017-03-10 DIAGNOSIS — Z5181 Encounter for therapeutic drug level monitoring: Secondary | ICD-10-CM

## 2017-03-10 DIAGNOSIS — F329 Major depressive disorder, single episode, unspecified: Secondary | ICD-10-CM

## 2017-03-10 DIAGNOSIS — F410 Panic disorder [episodic paroxysmal anxiety] without agoraphobia: Secondary | ICD-10-CM | POA: Insufficient documentation

## 2017-03-10 DIAGNOSIS — F3130 Bipolar disorder, current episode depressed, mild or moderate severity, unspecified: Secondary | ICD-10-CM

## 2017-03-10 DIAGNOSIS — F32A Depression, unspecified: Secondary | ICD-10-CM | POA: Insufficient documentation

## 2017-03-10 DIAGNOSIS — R635 Abnormal weight gain: Secondary | ICD-10-CM | POA: Diagnosis not present

## 2017-03-10 DIAGNOSIS — F419 Anxiety disorder, unspecified: Secondary | ICD-10-CM | POA: Diagnosis not present

## 2017-03-10 DIAGNOSIS — F319 Bipolar disorder, unspecified: Secondary | ICD-10-CM | POA: Insufficient documentation

## 2017-03-10 DIAGNOSIS — R300 Dysuria: Secondary | ICD-10-CM

## 2017-03-10 DIAGNOSIS — J4540 Moderate persistent asthma, uncomplicated: Secondary | ICD-10-CM | POA: Insufficient documentation

## 2017-03-10 LAB — POCT URINALYSIS DIP (MANUAL ENTRY)
BILIRUBIN UA: NEGATIVE
Blood, UA: NEGATIVE
Glucose, UA: NEGATIVE mg/dL
Ketones, POC UA: NEGATIVE mg/dL
NITRITE UA: NEGATIVE
PH UA: 6.5 (ref 5.0–8.0)
Spec Grav, UA: >=1.03 — AB (ref 1.010–1.025)
Urobilinogen, UA: 1 E.U./dL

## 2017-03-10 LAB — POCT CBC
Granulocyte percent: 72.2 %G (ref 37–80)
HEMATOCRIT: 37.8 % (ref 37.7–47.9)
Hemoglobin: 12.9 g/dL (ref 12.2–16.2)
LYMPH, POC: 1.8 (ref 0.6–3.4)
MCH, POC: 29.4 pg (ref 27–31.2)
MCHC: 34.2 g/dL (ref 31.8–35.4)
MCV: 85.9 fL (ref 80–97)
MID (cbc): 0.3 (ref 0–0.9)
MPV: 7.2 fL (ref 0–99.8)
POC GRANULOCYTE: 5.3 (ref 2–6.9)
POC LYMPH %: 23.7 % (ref 10–50)
POC MID %: 4.1 %M (ref 0–12)
Platelet Count, POC: 371 10*3/uL (ref 142–424)
RBC: 4.4 M/uL (ref 4.04–5.48)
RDW, POC: 13.5 %
WBC: 7.4 10*3/uL (ref 4.6–10.2)

## 2017-03-10 LAB — POCT GLYCOSYLATED HEMOGLOBIN (HGB A1C): HEMOGLOBIN A1C: 5

## 2017-03-10 MED ORDER — CIPROFLOXACIN HCL 500 MG PO TABS: 500.0000 mg | ORAL_TABLET | Freq: Two times a day (BID) | ORAL | 0 refills | Status: DC

## 2017-03-10 MED ORDER — MONTELUKAST SODIUM 10 MG PO TABS: 10.0000 mg | ORAL_TABLET | Freq: Every day | ORAL | 3 refills | Status: AC

## 2017-03-10 MED ORDER — FLUTICASONE-SALMETEROL 100-50 MCG/DOSE IN AEPB: 1.0000 | INHALATION_SPRAY | Freq: Two times a day (BID) | RESPIRATORY_TRACT | 0 refills | Status: DC

## 2017-03-10 NOTE — Patient Instructions (Addendum)
   IF you received an x-ray today, you will receive an invoice from [facility]. Please contact [facility] at [phone] with questions or concerns regarding your invoice.   IF you received labwork today, you will receive an invoice from [facility]. Please contact [facility] at [phone] with questions or concerns regarding your invoice.   Our billing staff will not be able to assist you with questions regarding bills from these companies.  You will be contacted with the lab results as soon as they are available. The fastest way to get your results is to activate your My Chart account. Instructions are located on the last page of this paperwork. If you have not heard from us regarding the results in 2 weeks, please contact this office.     Asthma, Adult Asthma is a recurring condition in which the airways tighten and narrow. Asthma can make it difficult to breathe. It can cause coughing, wheezing, and shortness of breath. Asthma episodes, also called asthma attacks, range from minor to life-threatening. Asthma cannot be cured, but medicines and lifestyle changes can help control it. What are the causes? Asthma is believed to be caused by inherited (genetic) and environmental factors, but its exact cause is unknown. Asthma may be triggered by allergens, lung infections, or irritants in the air. Asthma triggers are different for each person. Common triggers include:  Animal dander.  Dust mites.  Cockroaches.  Pollen from trees or grass.  Mold.  Smoke.  Air pollutants such as dust, household cleaners, hair sprays, aerosol sprays, paint fumes, strong chemicals, or strong odors.  Cold air, weather changes, and winds (which increase molds and pollens in the air).  Strong emotional expressions such as crying or laughing hard.  Stress.  Certain medicines (such as aspirin) or types of drugs (such as beta-blockers).  Sulfites in foods and drinks. Foods and drinks  that may contain sulfites include dried fruit, potato chips, and sparkling grape juice.  Infections or inflammatory conditions such as the flu, a cold, or an inflammation of the nasal membranes (rhinitis).  Gastroesophageal reflux disease (GERD).  Exercise or strenuous activity.  What are the signs or symptoms? Symptoms may occur immediately after asthma is triggered or many hours later. Symptoms include:  Wheezing.  Excessive nighttime or early morning coughing.  Frequent or severe coughing with a common cold.  Chest tightness.  Shortness of breath.  How is this diagnosed? The diagnosis of asthma is made by a review of your medical history and a physical exam. Tests may also be performed. These may include:  Lung function studies. These tests show how much air you breathe in and out.  Allergy tests.  Imaging tests such as X-rays.  How is this treated? Asthma cannot be cured, but it can usually be controlled. Treatment involves identifying and avoiding your asthma triggers. It also involves medicines. There are 2 classes of medicine used for asthma treatment:  Controller medicines. These prevent asthma symptoms from occurring. They are usually taken every day.  Reliever or rescue medicines. These quickly relieve asthma symptoms. They are used as needed and provide short-term relief.  Your health care provider will help you create an asthma action plan. An asthma action plan is a written plan for managing and treating your asthma attacks. It includes a list of your asthma triggers and how they may be avoided. It also includes information on when medicines should be taken and when their dosage should be changed. An action plan may also involve the   use of a device called a peak flow meter. A peak flow meter measures how well the lungs are working. It helps you monitor your condition. Follow these instructions at home:  Take medicines only as directed by your health care provider.  Speak with your health care provider if you have questions about how or when to take the medicines.  Use a peak flow meter as directed by your health care provider. Record and keep track of readings.  Understand and use the action plan to help minimize or stop an asthma attack without needing to seek medical care.  Control your home environment in the following ways to help prevent asthma attacks: ? Do not smoke. Avoid being exposed to secondhand smoke. ? Change your heating and air conditioning filter regularly. ? Limit your use of fireplaces and wood stoves. ? Get rid of pests (such as roaches and mice) and their droppings. ? Throw away plants if you see mold on them. ? Clean your floors and dust regularly. Use unscented cleaning products. ? Try to have someone else vacuum for you regularly. Stay out of rooms while they are being vacuumed and for a short while afterward. If you vacuum, use a dust mask from a hardware store, a double-layered or microfilter vacuum cleaner bag, or a vacuum cleaner with a HEPA filter. ? Replace carpet with wood, tile, or vinyl flooring. Carpet can trap dander and dust. ? Use allergy-proof pillows, mattress covers, and box spring covers. ? Wash bed sheets and blankets every week in hot water and dry them in a dryer. ? Use blankets that are made of polyester or cotton. ? Clean bathrooms and kitchens with bleach. If possible, have someone repaint the walls in these rooms with mold-resistant paint. Keep out of the rooms that are being cleaned and painted. ? Wash hands frequently. Contact a health care provider if:  You have wheezing, shortness of breath, or a cough even if taking medicine to prevent attacks.  The colored mucus you cough up (sputum) is thicker than usual.  Your sputum changes from clear or white to yellow, green, gray, or bloody.  You have any problems that may be related to the medicines you are taking (such as a rash, itching, swelling, or  trouble breathing).  You are using a reliever medicine more than 2-3 times per week.  Your peak flow is still at 50-79% of your personal best after following your action plan for 1 hour.  You have a fever. Get help right away if:  You seem to be getting worse and are unresponsive to treatment during an asthma attack.  You are short of breath even at rest.  You get short of breath when doing very little physical activity.  You have difficulty eating, drinking, or talking due to asthma symptoms.  You develop chest pain.  You develop a fast heartbeat.  You have a bluish color to your lips or fingernails.  You are light-headed, dizzy, or faint.  Your peak flow is less than 50% of your personal best. This information is not intended to replace advice given to you by your health care provider. Make sure you discuss any questions you have with your health care provider. Document Released: 10/24/2005 Document Revised: 04/06/2016 Document Reviewed: 05/23/2013 [employer] Interactive Patient Education  2017 [employer]  

## 2017-03-10 NOTE — Progress Notes (Signed)
Chief Complaint  Patient presents with  . Annual Exam    Wants prescription by hand  . Asthma  . Concentration issues  . Labs Only    check for anemia    Subjective:  Tracie Stevens is a 21 y.o. female here for a health maintenance visit.  Patient is new pt who is here for a physical exam and to discuss the following problems  Uncontrolled Asthma She has a history of uncontrolled asthma with daily symptoms since the change of the seasons.  She reports that she has runny nose, sorethroat and more coughing, shortness of breath and wheezing.  She only has albuterol inhaler.  She reports that she has been short of breath with activity  She does not take any controlled medications She has never been intubated due to asthma She has not had any ER visits due to asthma She would rate her asthma as uncontrolled and persistent.  Anxiety, Depression and Bipolar Disorder and disordered eating She reports that she gets lightheadedness, dizziness, tremors and feeling confused She went to the ER for panic attacks twice in the past 2 months She reports that she has lost 20 pounds and gained 20 pounds over 2 months She reports tha she also gets hallucinations   Her Psychiatrist is Dr. Julieanne MansonSchweichret She also gets counseling.   Iron Def anemia She reports that she reports that student health told her that she has  She reports that she has not been taking her iron She reports a history of palpitations  She gets very heavy periods  There are no active problems to display for this patient.   Past Medical History:  Diagnosis Date  . Anemia   . Anxiety   . Bipolar 1 disorder (HCC)   . Depression     No past surgical history on file.   Outpatient Medications Prior to Visit  Medication Sig Dispense Refill  . ARIPiprazole (ABILIFY) 5 MG tablet Take 5 mg by mouth daily.    . hydrOXYzine (ATARAX/VISTARIL) 50 MG tablet Take 25 mg by mouth daily as needed for itching.     . prazosin (MINIPRESS) 1  MG capsule Take 1 mg by mouth at bedtime.    . VENTOLIN HFA 108 (90 Base) MCG/ACT inhaler Take 2 puffs by mouth as needed for shortness of breath.     Marland Kitchen. VIIBRYD 20 MG TABS Take 20 mg by mouth daily.    Marland Kitchen. YASMIN 28 3-0.03 MG tablet Take 1 tablet by mouth daily.    . diphenhydrAMINE (BENADRYL) 25 mg capsule Take 25 mg by mouth every 6 (six) hours as needed for allergies.    Marland Kitchen. EPINEPHrine 0.3 mg/0.3 mL IJ SOAJ injection Inject 0.3 mg into the muscle as needed (allergic reaction).     . nitrofurantoin, macrocrystal-monohydrate, (MACROBID) 100 MG capsule Take 1 capsule by mouth 2 (two) times daily.    . ondansetron (ZOFRAN ODT) 4 MG disintegrating tablet Take 1 tablet (4 mg total) by mouth every 8 (eight) hours as needed for nausea or vomiting. (Patient not taking: Reported on 02/11/2017) 15 tablet 0  . predniSONE (STERAPRED UNI-PAK 21 TAB) 10 MG (21) TBPK tablet Take 6 tabs by mouth daily  for 2 days, then 5 tabs for 2 days, then 4 tabs for 2 days, then 3 tabs for 2 days, 2 tabs for 2 days, then 1 tab by mouth daily for 2 days (Patient not taking: Reported on 02/11/2017) 42 tablet 0   No facility-administered medications prior to visit.  Allergies  Allergen Reactions  . Other Anaphylaxis and Swelling    Nuts, shellfish, sesame seeds  . Peach Flavor Swelling     No family history on file.   Health Habits: Dental Exam: up to date Eye Exam: up to date Exercise: 2 times/week on average Current exercise activities: walking/running Diet: balanced  Social History   Social History  . Marital status: Single    Spouse name: N/A  . Number of children: N/A  . Years of education: N/A   Occupational History  . Not on file.   Social History Main Topics  . Smoking status: Never Smoker  . Smokeless tobacco: Never Used  . Alcohol use No  . Drug use: Unknown  . Sexual activity: Not on file   Other Topics Concern  . Not on file   Social History Narrative  . No narrative on file    History  Alcohol Use No   History  Smoking Status  . Never Smoker  Smokeless Tobacco  . Never Used   History  Drug use: Unknown    GYN: Sexual Health Menstrual status: regular menses LMP: Patient's last menstrual period was 02/10/2017. Last pap smear: see HM section History of abnormal pap smears:  Sexually active: with female partner Current contraception: n/a  Health Maintenance: See under health Maintenance activity for review of completion dates as well.  There is no immunization history on file for this patient.    Depression Screen-PHQ2/9  Depression screen [facility] 2/9 03/10/2017  Decreased Interest 1  Down, Depressed, Hopeless 1  PHQ - 2 Score 2  Altered sleeping 3  Tired, decreased energy 3  Change in appetite 3  Feeling bad or failure about yourself  1  Trouble concentrating 3  Moving slowly or fidgety/restless 3  Suicidal thoughts 0  PHQ-9 Score 18      Depression Severity and Treatment Recommendations:  0-4= None  5-9= Mild / Treatment: Support, educate to call if worse; return in one month  10-14= Moderate / Treatment: Support, watchful waiting; Antidepressant or Psycotherapy  15-19= Moderately severe / Treatment: Antidepressant OR Psychotherapy  >= 20 = Major depression, severe / Antidepressant AND Psychotherapy    Review of Systems   Review of Systems  HENT: Positive for sore throat. Negative for nosebleeds and sinus pain.   Respiratory: Positive for cough, shortness of breath and wheezing.        Pt reports that she gets daily shortness of breath and wheezing. She thinks her allergies is a trigger  Cardiovascular: Positive for palpitations.  Gastrointestinal: Positive for diarrhea and nausea.       Gets nausea and diarrhea during her menstrual cycles  Genitourinary: Positive for dysuria and frequency.       UTI couple weeks ago and was prescribed an antibiotic but symptoms remain   Neurological: Positive for dizziness and tremors.        +lightheadedness   Psychiatric/Behavioral: The patient is nervous/anxious and has insomnia.     See HPI for ROS as well.    Objective:   Vitals:   03/10/17 0916  BP: 108/70  Pulse: 98  Resp: 18  Temp: 98.3 F (36.8 C)  TempSrc: Oral  SpO2: 95%  Weight: 168 lb (76.2 kg)  Height: 5' 3.75" (1.619 m)    Body mass index is 29.06 kg/m.  Physical Exam  Constitutional: She is oriented to person, place, and time. She appears well-developed and well-nourished.  HENT:  Head: Normocephalic and atraumatic.  Right Ear: External  ear normal.  Left Ear: External ear normal.  Nose: Nose normal.  Mouth/Throat: Oropharynx is clear and moist.  Eyes: Conjunctivae and EOM are normal.  Neck: Normal range of motion. No thyromegaly present.  Cardiovascular: Normal rate, regular rhythm and normal heart sounds.   No murmur heard. Pulmonary/Chest: Effort normal and breath sounds normal. No respiratory distress. She has no wheezes.  Abdominal: Soft. Bowel sounds are normal. She exhibits no distension. There is no tenderness. There is no rebound.  Musculoskeletal: Normal range of motion. She exhibits no edema.  Neurological: She is alert and oriented to person, place, and time. She has normal reflexes. No cranial nerve deficit.  Skin: Skin is warm. No erythema.  Hyperpigmentation on face, neck and hands  Psychiatric: She has a normal mood and affect. Her behavior is normal. Judgment and thought content normal.       Assessment/Plan:   Patient was seen for a health maintenance exam.  Counseled the patient on health maintenance issues. Reviewed her health mainteance schedule and ordered appropriate tests (see orders.) Counseled on regular exercise and weight management. Recommend regular eye exams and dental cleaning.   The following issues were addressed today for health maintenance:   Tracie Stevens was seen today for annual exam, asthma, concentration issues and labs only.  Diagnoses and all orders  for this visit:  Encounter for health maintenance examination in adult- reviewed vaccinations Recommended tdap Pt will check her records Pap smear at age 89 Age appropriate anticipatory guidance reviewed  Bipolar depression (HCC)- untreated anemia can precipitate worsening depression Advised to continue current meds and follow up with Psychiatry No anemia on labs today -     POCT CBC  Anxiety and depression Panic attacks -  Continue with Psychiatry and BH for counseling  Iron deficiency anemia due to chronic blood loss-  Discussed that her hemoglobin is in a good range. She may benefit from iron when she is on her period -     POCT CBC  Moderate persistent asthma without complication- added singulair (which will also help with her eczema) and advair  Continue prn albuterol   Dysuria- large LE and blood, will treat with Cipro 500mg  bid for 3 days -     POCT urinalysis dipstick  Encounter for medication monitoring- pt on abilify, will check a1c -     POCT glycosylated hemoglobin (Hb A1C)  Weight gain -     POCT glycosylated hemoglobin (Hb A1C)  Other orders -     montelukast (SINGULAIR) 10 MG tablet; Take 1 tablet (10 mg total) by mouth at bedtime. -     Fluticasone-Salmeterol (ADVAIR) 100-50 MCG/DOSE AEPB; Inhale 1 puff into the lungs 2 (two) times daily.    No Follow-up on file.    Body mass index is 29.06 kg/m.:  Discussed the patient's BMI with patient. The BMI body mass index is 29.06 kg/m.     No future appointments.  Patient Instructions       IF you received an x-ray today, you will receive an invoice from [facility]. Please contact [facility] at [phone] with questions or concerns regarding your invoice.   IF you received labwork today, you will receive an invoice from [facility]. Please contact [facility] at [phone] with questions or concerns regarding your invoice.   Our billing staff will not be able to assist you with  questions regarding bills from these companies.  You will be contacted with the lab results as soon as they are available. The fastest  way to get your results is to activate your My Chart account. Instructions are located on the last page of this paperwork. If you have not heard from [place] regarding the results in 2 weeks, please contact this office.     Asthma, Adult Asthma is a recurring condition in which the airways tighten and narrow. Asthma can make it difficult to breathe. It can cause coughing, wheezing, and shortness of breath. Asthma episodes, also called asthma attacks, range from minor to life-threatening. Asthma cannot be cured, but medicines and lifestyle changes can help control it. What are the causes? Asthma is believed to be caused by inherited (genetic) and environmental factors, but its exact cause is unknown. Asthma may be triggered by allergens, lung infections, or irritants in the air. Asthma triggers are different for each person. Common triggers include:  Animal dander.  Dust mites.  Cockroaches.  Pollen from trees or grass.  Mold.  Smoke.  Air pollutants such as dust, household cleaners, hair sprays, aerosol sprays, paint fumes, strong chemicals, or strong odors.  Cold air, weather changes, and winds (which increase molds and pollens in the air).  Strong emotional expressions such as crying or laughing hard.  Stress.  Certain medicines (such as aspirin) or types of drugs (such as beta-blockers).  Sulfites in foods and drinks. Foods and drinks that may contain sulfites include dried fruit, potato chips, and sparkling grape juice.  Infections or inflammatory conditions such as the flu, a cold, or an inflammation of the nasal membranes (rhinitis).  Gastroesophageal reflux disease (GERD).  Exercise or strenuous activity. What are the signs or symptoms? Symptoms may occur immediately after asthma is triggered or many hours later. Symptoms  include:  Wheezing.  Excessive nighttime or early morning coughing.  Frequent or severe coughing with a common cold.  Chest tightness.  Shortness of breath. How is this diagnosed? The diagnosis of asthma is made by a review of your medical history and a physical exam. Tests may also be performed. These may include:  Lung function studies. These tests show how much air you breathe in and out.  Allergy tests.  Imaging tests such as X-rays. How is this treated? Asthma cannot be cured, but it can usually be controlled. Treatment involves identifying and avoiding your asthma triggers. It also involves medicines. There are 2 classes of medicine used for asthma treatment:  Controller medicines. These prevent asthma symptoms from occurring. They are usually taken every day.  Reliever or rescue medicines. These quickly relieve asthma symptoms. They are used as needed and provide short-term relief. Your health care provider will help you create an asthma action plan. An asthma action plan is a written plan for managing and treating your asthma attacks. It includes a list of your asthma triggers and how they may be avoided. It also includes information on when medicines should be taken and when their dosage should be changed. An action plan may also involve the use of a device called a peak flow meter. A peak flow meter measures how well the lungs are working. It helps you monitor your condition. Follow these instructions at home:  Take medicines only as directed by your health care provider. Speak with your health care provider if you have questions about how or when to take the medicines.  Use a peak flow meter as directed by your health care provider. Record and keep track of readings.  Understand and use the action plan to help minimize or stop an asthma attack without needing  to seek medical care.  Control your home environment in the following ways to help prevent asthma attacks:  Do  not smoke. Avoid being exposed to secondhand smoke.  Change your heating and air conditioning filter regularly.  Limit your use of fireplaces and wood stoves.  Get rid of pests (such as roaches and mice) and their droppings.  Throw away plants if you see mold on them.  Clean your floors and dust regularly. Use unscented cleaning products.  Try to have someone else vacuum for you regularly. Stay out of rooms while they are being vacuumed and for a short while afterward. If you vacuum, use a dust mask from a hardware store, a double-layered or microfilter vacuum cleaner bag, or a vacuum cleaner with a HEPA filter.  Replace carpet with wood, tile, or vinyl flooring. Carpet can trap dander and dust.  Use allergy-proof pillows, mattress covers, and box spring covers.  Wash bed sheets and blankets every week in hot water and dry them in a dryer.  Use blankets that are made of polyester or cotton.  Clean bathrooms and kitchens with bleach. If possible, have someone repaint the walls in these rooms with mold-resistant paint. Keep out of the rooms that are being cleaned and painted.  Wash hands frequently. Contact a health care provider if:  You have wheezing, shortness of breath, or a cough even if taking medicine to prevent attacks.  The colored mucus you cough up (sputum) is thicker than usual.  Your sputum changes from clear or white to yellow, green, gray, or bloody.  You have any problems that may be related to the medicines you are taking (such as a rash, itching, swelling, or trouble breathing).  You are using a reliever medicine more than 2-3 times per week.  Your peak flow is still at 50-79% of your personal best after following your action plan for 1 hour.  You have a fever. Get help right away if:  You seem to be getting worse and are unresponsive to treatment during an asthma attack.  You are short of breath even at rest.  You get short of breath when doing very  little physical activity.  You have difficulty eating, drinking, or talking due to asthma symptoms.  You develop chest pain.  You develop a fast heartbeat.  You have a bluish color to your lips or fingernails.  You are light-headed, dizzy, or faint.  Your peak flow is less than 50% of your personal best. This information is not intended to replace advice given to you by your health care provider. Make sure you discuss any questions you have with your health care provider. Document Released: 10/24/2005 Document Revised: 04/06/2016 Document Reviewed: 05/23/2013 Elsevier Interactive Patient Education  2017 [employer].

## 2017-04-10 ENCOUNTER — Telehealth: Payer: Self-pay | Admitting: General Practice

## 2017-04-10 NOTE — Telephone Encounter (Signed)
Patient called to inquire about testosterone for being transgender. I advised patient the Dr. Everardo AllEllison does do the hormonal therapy at Endocrinology and only goes by referral. Patient will get referral in and I advised that someone from the office would be giving the patient a call.

## 2017-04-12 ENCOUNTER — Ambulatory Visit: Payer: 59 | Admitting: Family Medicine

## 2017-04-12 NOTE — Telephone Encounter (Signed)
Patient had therapist e-mail a form over about HRT and the referral. Patient was not sure what e-mail this form was sent to. Please advise and contact patient concerning this form.

## 2017-04-12 NOTE — Telephone Encounter (Signed)
Attempted to contact pt, phone number did not connect, we have not yet received the referral

## 2017-04-17 ENCOUNTER — Telehealth: Payer: Self-pay | Admitting: General Practice

## 2017-04-17 NOTE — Telephone Encounter (Signed)
Patient called for new patient appt. Referral faxed in from dr Rene Kocherregina alexander for hormone replacement therapy- ellison. Verified cell #

## 2017-04-20 ENCOUNTER — Other Ambulatory Visit: Payer: Self-pay | Admitting: Family Medicine

## 2017-04-21 ENCOUNTER — Other Ambulatory Visit: Payer: Self-pay | Admitting: Family Medicine

## 2017-09-01 ENCOUNTER — Ambulatory Visit (INDEPENDENT_AMBULATORY_CARE_PROVIDER_SITE_OTHER): Payer: 59 | Admitting: Physician Assistant

## 2017-09-01 ENCOUNTER — Encounter: Payer: Self-pay | Admitting: Physician Assistant

## 2017-09-01 ENCOUNTER — Ambulatory Visit (HOSPITAL_COMMUNITY)
Admission: RE | Admit: 2017-09-01 | Discharge: 2017-09-01 | Disposition: A | Discharge status: Home / Self Care | Payer: 59 | Source: Ambulatory Visit | Attending: Physician Assistant | Admitting: Physician Assistant

## 2017-09-01 VITALS — BP 107/74 | HR 83 | Temp 98.1°F | Resp 16 | Ht 63.0 in | Wt 188.2 lb

## 2017-09-01 DIAGNOSIS — R112 Nausea with vomiting, unspecified: Secondary | ICD-10-CM

## 2017-09-01 DIAGNOSIS — S0990XA Unspecified injury of head, initial encounter: Secondary | ICD-10-CM | POA: Diagnosis not present

## 2017-09-01 NOTE — Patient Instructions (Addendum)
You are scheduled for your CT exam at [facility]. Please go there directly following your visit today. You will check in at the main entrance (NOT ED) and they will guide you to the radiology department.   Thank you for coming in today. I hope you feel we met your needs.  Feel free to call PCP if you have any questions or further requests.  Please consider signing up for MyChart if you do not already have it, as this is a great way to communicate with me.  Best,  [employer], PA-C

## 2017-09-01 NOTE — Progress Notes (Signed)
Tracie Stevens  MRN: [id number] DOB: Jul 08, 1996  PCP: Patient, No Pcp Per  Subjective:  Pt is a 21 year old trans female to female PMH asthma, bipolar, anxiety, depression, panic attacks who presents to clinic for dizziness. She is here today with her partner.  She fell 1 week ago and hit her head. She was leaning over her bed when she fell and hit her head on the windowsill.  She started throwing up the day after she hit her head. She has been having 1-2 episodes of vomiting per day since her injury. She hasn't felt like eating the past few days. Last episode of vomiting was yesterday.   Endorses recent increased life stressors - she normally throws up when she's anxious. She has had a lot of panic attacks this week. Endorses recent increased sensitivity to light. However her depression causes her to be sensitive to light on a normal basis.   Her partner states the pt has exhibited slowed speech and is mumbling more than usual.   Review of Systems  Eyes: Positive for photophobia.  Gastrointestinal: Positive for nausea and vomiting.  Neurological: Positive for dizziness and headaches.  Psychiatric/Behavioral: Positive for dysphoric mood. The patient is nervous/anxious.     Patient Active Problem List   Diagnosis Date Noted  . Bipolar depression (HCC) 03/10/2017  . Anxiety and depression 03/10/2017  . Panic attacks 03/10/2017  . Iron deficiency anemia due to chronic blood loss 03/10/2017  . Moderate persistent asthma without complication 03/10/2017    Current Outpatient Prescriptions on File Prior to Visit  Medication Sig Dispense Refill  . ADVAIR DISKUS 100-50 MCG/DOSE AEPB INHALE 1 PUFF INTO THE LUNGS TWICE DAILY 1 each 2  . ARIPiprazole (ABILIFY) 5 MG tablet Take 5 mg by mouth daily.    . ciprofloxacin (CIPRO) 500 MG tablet Take 1 tablet (500 mg total) by mouth 2 (two) times daily. 6 tablet 0  . diphenhydrAMINE (BENADRYL) 25 mg capsule Take 25 mg by mouth every 6 (six) hours as  needed for allergies.    Marland Kitchen. EPINEPHrine 0.3 mg/0.3 mL IJ SOAJ injection Inject 0.3 mg into the muscle as needed (allergic reaction).     . hydrOXYzine (ATARAX/VISTARIL) 50 MG tablet Take 25 mg by mouth daily as needed for itching.     . montelukast (SINGULAIR) 10 MG tablet Take 1 tablet (10 mg total) by mouth at bedtime. 30 tablet 3  . prazosin (MINIPRESS) 1 MG capsule Take 1 mg by mouth at bedtime.    . VENTOLIN HFA 108 (90 Base) MCG/ACT inhaler Take 2 puffs by mouth as needed for shortness of breath.     Marland Kitchen. VIIBRYD 20 MG TABS Take 20 mg by mouth daily.    Marland Kitchen. YASMIN 28 3-0.03 MG tablet Take 1 tablet by mouth daily.     No current facility-administered medications on file prior to visit.     Allergies  Allergen Reactions  . Other Anaphylaxis and Swelling    Nuts, shellfish, sesame seeds  . Peach Flavor Swelling     Objective:  BP 107/74   Pulse 83   Temp 98.1 F (36.7 C) (Oral)   Resp 16   Ht 5\' 3"  (1.6 m)   Wt 188 lb 3.2 oz (85.4 kg)   LMP 08/21/2017   SpO2 99%   BMI 33.34 kg/m  Orthostatic VS for the past 24 hrs:  BP- Lying Pulse- Lying BP- Sitting Pulse- Sitting BP- Standing at 0 minutes Pulse- Standing at 0 minutes  09/01/17 1218 107/74 84 104/73 84 123/85 96    Physical Exam  Constitutional: She is oriented to person, place, and time and well-developed, well-nourished, and in no distress. No distress.  Eyes: Pupils are equal, round, and reactive to light. Conjunctivae and EOM are normal. Right eye exhibits no nystagmus. Left eye exhibits no nystagmus.  Cardiovascular: Normal rate, regular rhythm and normal heart sounds.   Neurological: She is alert and oriented to person, place, and time. She has normal motor skills, normal sensation, normal strength, normal reflexes and intact cranial nerves. She has a normal Heel to [employer] and a normal Tandem Gait Test. She shows no pronator drift. Gait normal. GCS score is 15.  Skin: Skin is warm and dry. No bruising noted.  Lips are  dry and peeling.   Psychiatric: Mood, memory, affect and judgment normal.  Vitals reviewed.   Assessment and Plan :  1. Traumatic injury of head, initial encounter 2. Nausea and vomiting, intractability of vomiting not specified, unspecified vomiting type - CT Head Wo Contrast; Future - Pt c/o head trauma with >3 episodes of vomiting and photophobia following the injury. Her partner endorses increased slurred speech. Her symptoms are difficulty to tease from her symptoms related to her mental health - she states she gets photophobia, nauseous with vomiting with increased anxiety, which she has experienced recently. However, cannot r/o intracranial process, will procede with imaging.   Marco Collie, PA-C  Primary Care at [facility] 09/01/2017 12:32 PM

## 2017-09-05 ENCOUNTER — Encounter (HOSPITAL_COMMUNITY): Payer: Self-pay | Admitting: Emergency Medicine

## 2017-09-05 ENCOUNTER — Ambulatory Visit (HOSPITAL_COMMUNITY)
Admission: EM | Admit: 2017-09-05 | Discharge: 2017-09-05 | Disposition: A | Discharge status: Home / Self Care | Payer: 59

## 2017-09-05 ENCOUNTER — Emergency Department (HOSPITAL_COMMUNITY)
Admission: EM | Admit: 2017-09-05 | Discharge: 2017-09-06 | Disposition: A | Discharge status: Home / Self Care | Payer: 59 | Attending: Physician Assistant | Admitting: Physician Assistant

## 2017-09-05 DIAGNOSIS — Z79899 Other long term (current) drug therapy: Secondary | ICD-10-CM | POA: Diagnosis not present

## 2017-09-05 DIAGNOSIS — R51 Headache: Secondary | ICD-10-CM | POA: Diagnosis present

## 2017-09-05 DIAGNOSIS — G44309 Post-traumatic headache, unspecified, not intractable: Secondary | ICD-10-CM | POA: Diagnosis not present

## 2017-09-05 MED ORDER — METOCLOPRAMIDE HCL 5 MG/ML IJ SOLN
10.0000 mg | Freq: Once | INTRAMUSCULAR | Status: AC
  Administered 2017-09-05: 10 mg via INTRAVENOUS
  Filled 2017-09-05: qty 2

## 2017-09-05 MED ORDER — ONDANSETRON 4 MG PO TBDP
ORAL_TABLET | ORAL | Status: AC
  Administered 2017-09-05: 4 mg
  Filled 2017-09-05: qty 1

## 2017-09-05 MED ORDER — DIPHENHYDRAMINE HCL 50 MG/ML IJ SOLN
12.5000 mg | Freq: Once | INTRAMUSCULAR | Status: AC
  Administered 2017-09-05: 12.5 mg via INTRAVENOUS
  Filled 2017-09-05: qty 1

## 2017-09-05 MED ORDER — IBUPROFEN 400 MG PO TABS
ORAL_TABLET | ORAL | Status: AC
  Administered 2017-09-05: 400 mg
  Filled 2017-09-05: qty 1

## 2017-09-05 MED ORDER — DEXAMETHASONE SODIUM PHOSPHATE 10 MG/ML IJ SOLN
10.0000 mg | Freq: Once | INTRAMUSCULAR | Status: AC
  Administered 2017-09-05: 10 mg via INTRAVENOUS
  Filled 2017-09-05: qty 1

## 2017-09-05 NOTE — ED Triage Notes (Signed)
Pt requesting more Motrin, advised that it has not been long enough to administer another dose.

## 2017-09-05 NOTE — ED Notes (Signed)
Per Dr. Corlis LeakMackuen, no repeat CT at this time since pt had one done 10/26.

## 2017-09-05 NOTE — ED Provider Notes (Signed)
[facility] EMERGENCY DEPARTMENT Provider Note   CSN: [id number] Arrival date & time: 09/05/17  1955     History   Chief Complaint Chief Complaint  Patient presents with  . Head Injury    HPI Tracie Stevens is a 21 y.o. female.  Patient with history of bipolar, anxiety, asthma presents with headache, photosensitivity, nausea since head injury several days ago. No fever, falls, syncope. She reports one episode vomiting in the last 24 hours. She is able to eat and drink without difficulty. She was seen at urgent care who sent her here for pain management.    The history is provided by the patient. No language interpreter was used.  Head Injury   Pertinent negatives include no vomiting and no weakness.    Past Medical History:  Diagnosis Date  . Anemia   . Anxiety   . Bipolar 1 disorder (HCC)   . Depression     Patient Active Problem List   Diagnosis Date Noted  . Bipolar depression (HCC) 03/10/2017  . Anxiety and depression 03/10/2017  . Panic attacks 03/10/2017  . Iron deficiency anemia due to chronic blood loss 03/10/2017  . Moderate persistent asthma without complication 03/10/2017    History reviewed. No pertinent surgical history.  OB History    No data available       Home Medications    Prior to Admission medications   Medication Sig Start Date End Date Taking? Authorizing Provider  ADVAIR DISKUS 100-50 MCG/DOSE AEPB INHALE 1 PUFF INTO THE LUNGS TWICE DAILY 04/21/17   Collie Siad A, MD  ARIPiprazole (ABILIFY) 5 MG tablet Take 5 mg by mouth daily. 02/08/17   [provider]  ciprofloxacin (CIPRO) 500 MG tablet Take 1 tablet (500 mg total) by mouth 2 (two) times daily. 03/10/17   Doristine Bosworth, MD  diphenhydrAMINE (BENADRYL) 25 mg capsule Take 25 mg by mouth every 6 (six) hours as needed for allergies.    [provider]  EPINEPHrine 0.3 mg/0.3 mL IJ SOAJ injection Inject 0.3 mg into the muscle as needed (allergic  reaction).  01/02/17   [provider]  hydrOXYzine (ATARAX/VISTARIL) 50 MG tablet Take 25 mg by mouth daily as needed for itching.  10/06/16   [provider]  montelukast (SINGULAIR) 10 MG tablet Take 1 tablet (10 mg total) by mouth at bedtime. 03/10/17   Doristine Bosworth, MD  prazosin (MINIPRESS) 1 MG capsule Take 1 mg by mouth at bedtime. 02/08/17   [provider]  VENTOLIN HFA 108 (90 Base) MCG/ACT inhaler Take 2 puffs by mouth as needed for shortness of breath.  12/14/16   [provider]  VIIBRYD 20 MG TABS Take 20 mg by mouth daily. 01/31/17   [provider]  YASMIN 28 3-0.03 MG tablet Take 1 tablet by mouth daily. 10/12/16   [provider]    Family History No family history on file.  Social History Social History  Substance Use Topics  . Smoking status: Never Smoker  . Smokeless tobacco: Never Used  . Alcohol use No     Allergies   Other and Peach flavor   Review of Systems Review of Systems  Constitutional: Negative for chills and fever.  HENT: Negative.   Eyes: Positive for photophobia.  Respiratory: Negative.   Cardiovascular: Negative.   Gastrointestinal: Positive for nausea. Negative for vomiting.  Musculoskeletal: Positive for neck pain.  Skin: Negative.   Neurological: Positive for light-headedness. Negative for syncope and weakness.  Physical Exam Updated Vital Signs BP (!) 117/101 (BP Location: Left Arm)   Pulse 98   Temp 98.8 F (37.1 C) (Oral)   Resp 16   Ht 5\' 4"  (1.626 m)   Wt 85.3 kg (188 lb)   LMP 08/21/2017   SpO2 99%   BMI 32.27 kg/m   Physical Exam  Constitutional: She is oriented to person, place, and time. She appears well-developed and well-nourished.  HENT:  Head: Normocephalic.  Eyes: Pupils are equal, round, and reactive to light.  Neck: Normal range of motion. Neck supple.  Cardiovascular: Normal rate and regular rhythm.   Pulmonary/Chest: Effort normal and breath sounds  normal. She has no wheezes. She has no rales.  Abdominal: Soft. Bowel sounds are normal. There is no tenderness. There is no rebound and no guarding.  Musculoskeletal: Normal range of motion.  Neurological: She is alert and oriented to person, place, and time. She has normal strength and normal reflexes. No sensory deficit. She displays a negative Romberg sign. Coordination normal.  CN's 3-12 grossly intact. Speech is clear and focused. No facial asymmetry. No lateralizing weakness. Reflexes are equal. No deficits of coordination. Ambulatory without imbalance.    Skin: Skin is warm and dry. No rash noted.  Psychiatric: She has a normal mood and affect.     ED Treatments / Results  Labs (all labs ordered are listed, but only abnormal results are displayed) Labs Reviewed - No data to display  EKG  EKG Interpretation None       Radiology No results found.  Procedures Procedures (including critical care time)  Medications Ordered in ED Medications  ibuprofen (ADVIL,MOTRIN) 400 MG tablet (400 mg  Given 09/05/17 2016)  ondansetron (ZOFRAN-ODT) 4 MG disintegrating tablet (4 mg  Given 09/05/17 2016)  metoCLOPramide (REGLAN) injection 10 mg (10 mg Intravenous Given 09/05/17 2317)  diphenhydrAMINE (BENADRYL) injection 12.5 mg (12.5 mg Intravenous Given 09/05/17 2317)  dexamethasone (DECADRON) injection 10 mg (10 mg Intravenous Given 09/05/17 2317)     Initial Impression / Assessment and Plan / ED Course  I have reviewed the triage vital signs and the nursing notes.  Pertinent labs & imaging results that were available during my care of the patient were reviewed by me and considered in my medical decision making (see chart for details).     Patient with post-concussive syndrome headache. Review of chart shows a negative CT head on 09/01/17.  The patient has a negative neurologic exam and appears well. No evidence of dehydration. Headache cocktail provided with relief of headache.  She is felt appropriate for discharge home.   Final Clinical Impressions(s) / ED Diagnoses   Final diagnoses:  None   1. Post-concussion syndrome  New Prescriptions New Prescriptions   No medications on file     Elpidio AnisUpstill, Elveta Rape, Cordelia Poche-C 09/06/17 0041    Abelino DerrickMackuen, Courteney Lyn, MD 09/09/17 1342
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
## 2017-09-06 MED ORDER — ONDANSETRON 4 MG PO TBDP: 4.0000 mg | ORAL_TABLET | Freq: Three times a day (TID) | ORAL | 0 refills | Status: DC | PRN

## 2017-10-24 ENCOUNTER — Encounter: Payer: 59 | Attending: Family Medicine | Admitting: *Deleted

## 2017-10-24 DIAGNOSIS — Z713 Dietary counseling and surveillance: Secondary | ICD-10-CM | POA: Diagnosis present

## 2017-10-24 DIAGNOSIS — F5002 Anorexia nervosa, binge eating/purging type: Secondary | ICD-10-CM | POA: Insufficient documentation

## 2017-10-24 DIAGNOSIS — F509 Eating disorder, unspecified: Secondary | ICD-10-CM

## 2017-10-24 NOTE — Patient Instructions (Signed)
Start back OCP 1 hour exercise, rather than 90 minutes 3 meals/day- oatmeal or grits with protein shake/smoothie situation  Try Wow Butter which is soy based.  Also [employer]

## 2017-10-24 NOTE — Progress Notes (Signed)
Appointment start time: 0830  Appointment end time: 0930  Patient was seen on 10/24/17 for nutrition counseling pertaining to disordered eating  Primary care provider: Dr. Reola CalkinsBeck Therapist: Rene Kocherregina Tracie Stevens Any other medical team members: corey burkeet at [employer] [facility]   Assessment He/his prnouns: "Tracie Stevens" Has had trouble eating.  eitherr eating too much or not at all.  When in high school lost 100 pounds quickly by not eating.  Is now binge eating.   Working with therapist on why he goes through extremes Thinks weight is affecting his asthma.  Also thinks weight/binge eating affect his depression.  Feels badly about himself.    Thinks things were better when he started college. After friend died, he gained 60 pounds.  Is maintaining, but wants to lose.  120-160 pound is goal.  Not using hormones  Feels like he binges in the cafeteria with meal plan Has on campus apartment and can cook.  Likes to [employer]  Family history of fatness.  Messages about weight/health from family are hurtful Food growing up was energy dense.  Stayed with grandmother most of the time  Grew up eating out a lot  Not many vegetables  Sometimes counts calorie.  Sometimes uses MyFitness Pal. Aiming for 1200 kcal.   Sometimes exercises 2/day.  Sometimes goes when bored  joining DBT group at school  Came out as trans last semester.  Family not supportive.  Not able to pursue gender affirming care.  Having periods is very traumatic.  Dysphoria worse, depression worse, eating worse.  Sometimes restricts to point of not having periods.  Was taking OCP and stopped.    Growth Metrics: Median BMI for age: 5723 for males Current BMI: 25.7 % median today:  100% Previous growth data: varied Goal to normalize eating behaviors   Weight history:  Highest weight: 190 pounds recently   Lowest weight: 100 pounds in high school by not eating Most consistent weight: 170  What would you like to weigh:120-160 How has  weight changed in the past year: gained and lost  Medical Information:  Changes in hair, skin, nails since ED started: eczema flaired up.  Thinks it's weight related Chewing/swallowing difficulties : none Relux or heartburn: once a week.  Doesn't take any meds Trouble with teeth: none LMP without the use of hormones: last week.  Regular cycles   Constipation, diarrhea: lactose intolerant and consumes cheese.  Gets constipated.  Strain. no blood Sleeping better (had a concussion) Energy ok.  More depression at night Feet always cold Difficult focusing Mood pretty good lately Dizziness headaches   Mental health diagnosis: NA.  Potentially OSFED   Dietary assessment: A typical day consists of 2meals and 0 snacks  Safe foods include: pasta, chicken, fish, fast food Avoided foods include:fast food, chips, cookies (having them in his house), ice cream Doesn't like asparagus, brussel sprouts, broccoli, hot dogs  Lactose intolerant  Nut, shellfish, and peach allergy  24 hour recall:  B: none L: grilled cheese sandwich S: Strawberry banana smoothie D: chicken sandwich fried in evoo Beverages: fruit juice, water.     What Methods Do You Use To Control Your Weight (Compensatory behaviors)?           Restricting (calories, fat, carbs): tries to stick to 1200 kcal (recommendation from MyFitness Pal for female   weight loss)  SIV- not anymore  Diet pills- denies  Laxatives- denies  Diuretics- denies  Alcohol or drugs- not reporteed  Exercise (what type)- daily.  Walking onTreadmill  or elliptical or rower for 1 hour.  Weight machines rotated   throughout for 30 mintues  Food rules or rituals (explain)- non reported  Binge - whatever is there almost daily.    EAT-26 score: 36  Estimated energy intake: 1000 kcal outside of binge  Estimated energy needs: 2800-3000 kcal for active female 350-375 g CHO 140-150 g pro 93-100 g fat  Nutrition Diagnosis: NB-1.5 Disordered eating  pattern As related to meal skipping and over eating.  As evidenced by dietary recall.  Intervention/Goals: nutrition counseling provided.  Discussed how trans identity affects his eating behaviors.  He would like to go back on OCP to stop menses and will follow up with PCP to do so.  Not permissible to pursue further gender affirming treatment with Dr. Marina GoodellPerry at this time  Discussed food is fuel and what happens when he doesn't get enough fuel.  Discussed restrict/binge cycle.  Discussed finding balance between restrictive eating recommended by his family and binging.  Goals: Start back OCP 1 hour exercise, rather than 90 minutes 3 meals/day- oatmeal or grits with protein shake/smoothie situation  Try Wow Butter which is soy based.  Also Sunbutter .  Check out TFfed and support groups at [facility]  Monitoring and Evaluation: Patient will follow up in 6 weeks [place]

## 2017-11-30 ENCOUNTER — Ambulatory Visit: Payer: 59 | Admitting: Family Medicine

## 2017-12-13 ENCOUNTER — Ambulatory Visit: Payer: 59 | Admitting: *Deleted

## 2017-12-26 ENCOUNTER — Ambulatory Visit (HOSPITAL_COMMUNITY)
Admission: RE | Admit: 2017-12-26 | Discharge: 2017-12-26 | Disposition: A | Discharge status: Home / Self Care | Payer: 59 | Attending: Psychiatry | Admitting: Psychiatry

## 2017-12-26 DIAGNOSIS — F419 Anxiety disorder, unspecified: Secondary | ICD-10-CM | POA: Diagnosis not present

## 2017-12-26 DIAGNOSIS — F3113 Bipolar disorder, current episode manic without psychotic features, severe: Secondary | ICD-10-CM | POA: Diagnosis present

## 2017-12-26 DIAGNOSIS — Z91013 Allergy to seafood: Secondary | ICD-10-CM | POA: Insufficient documentation

## 2017-12-26 DIAGNOSIS — Z915 Personal history of self-harm: Secondary | ICD-10-CM | POA: Diagnosis not present

## 2017-12-26 DIAGNOSIS — Z91018 Allergy to other foods: Secondary | ICD-10-CM | POA: Insufficient documentation
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
## 2018-07-26 ENCOUNTER — Emergency Department (HOSPITAL_COMMUNITY)
Admission: EM | Admit: 2018-07-26 | Discharge: 2018-07-26 | Disposition: A | Discharge status: Home / Self Care | Payer: 59 | Attending: Emergency Medicine | Admitting: Emergency Medicine

## 2018-07-26 ENCOUNTER — Emergency Department (HOSPITAL_COMMUNITY): Payer: 59

## 2018-07-26 ENCOUNTER — Other Ambulatory Visit: Payer: Self-pay

## 2018-07-26 ENCOUNTER — Encounter (HOSPITAL_COMMUNITY): Payer: Self-pay | Admitting: Emergency Medicine

## 2018-07-26 DIAGNOSIS — Z79899 Other long term (current) drug therapy: Secondary | ICD-10-CM | POA: Diagnosis not present

## 2018-07-26 DIAGNOSIS — J189 Pneumonia, unspecified organism: Secondary | ICD-10-CM

## 2018-07-26 DIAGNOSIS — J029 Acute pharyngitis, unspecified: Secondary | ICD-10-CM | POA: Diagnosis present

## 2018-07-26 LAB — GROUP A STREP BY PCR: GROUP A STREP BY PCR: NOT DETECTED

## 2018-07-26 MED ORDER — ACETAMINOPHEN 325 MG PO TABS
650.0000 mg | ORAL_TABLET | Freq: Once | ORAL | Status: AC
  Administered 2018-07-26: 650 mg via ORAL
  Filled 2018-07-26: qty 2

## 2018-07-26 MED ORDER — DOXYCYCLINE HYCLATE 100 MG PO CAPS: 100.0000 mg | ORAL_CAPSULE | Freq: Two times a day (BID) | ORAL | 0 refills | Status: DC

## 2018-07-26 MED ORDER — ALBUTEROL SULFATE HFA 108 (90 BASE) MCG/ACT IN AERS: 2.0000 | INHALATION_SPRAY | RESPIRATORY_TRACT | 2 refills | Status: AC | PRN

## 2018-07-26 MED ORDER — DEXAMETHASONE 4 MG PO TABS
10.0000 mg | ORAL_TABLET | Freq: Once | ORAL | Status: AC
  Administered 2018-07-26: 10 mg via ORAL
  Filled 2018-07-26: qty 3

## 2018-07-26 NOTE — Discharge Instructions (Addendum)
Please read attached information. If you experience any new or worsening signs or symptoms please return to the emergency room for evaluation. Please follow-up with your primary care provider or specialist as discussed. Please use medication prescribed only as directed and discontinue taking if you have any concerning signs or symptoms.   °

## 2018-07-26 NOTE — ED Notes (Signed)
Patient transported to X-ray 

## 2018-07-26 NOTE — ED Triage Notes (Signed)
PT has been having URI and asthma issues. She states coughing up bright red blood. She sounds congested.

## 2018-07-26 NOTE — ED Notes (Signed)
Pt verbalized understanding of discharge paperwork and prescriptions reviewed.  

## 2018-07-26 NOTE — ED Provider Notes (Signed)
[facility] EMERGENCY DEPARTMENT Provider Note   CSN: [id number] Arrival date & time: 07/26/18  1246     History   Chief Complaint Chief Complaint  Patient presents with  . Hemoptysis    HPI Tracie Stevens is a 22 y.o. adult.  HPI   22 year old female presents today with complaints of sore throat.  Patient notes symptoms started 4 days ago with pain to the throat, painful swallowing, and fever.  She notes a history of asthma and dry nonproductive cough, she notes this has been acutely worsened by the recent infection.  Patient notes that she is able to tolerate her secretions, she denies any chest pain or shortness of breath.  She notes she used her inhaler this morning which resolved her respiratory symptoms.  She notes her partner is also experiencing similar upper respiratory complaints.  Past Medical History:  Diagnosis Date  . Anemia   . Anxiety   . Bipolar 1 disorder (HCC)   . Depression     Patient Active Problem List   Diagnosis Date Noted  . Bipolar depression (HCC) 03/10/2017  . Anxiety and depression 03/10/2017  . Panic attacks 03/10/2017  . Iron deficiency anemia due to chronic blood loss 03/10/2017  . Moderate persistent asthma without complication 03/10/2017    History reviewed. No pertinent surgical history.   OB History   None      Home Medications    Prior to Admission medications   Medication Sig Start Date End Date Taking? Authorizing Provider  ADVAIR DISKUS 100-50 MCG/DOSE AEPB INHALE 1 PUFF INTO THE LUNGS TWICE DAILY 04/21/17   Doristine Bosworth, MD  albuterol (PROVENTIL HFA;VENTOLIN HFA) 108 (90 Base) MCG/ACT inhaler Inhale 2 puffs into the lungs every 4 (four) hours as needed for wheezing or shortness of breath. 07/26/18   Aidah Forquer, Tinnie Gens, PA-C  ARIPiprazole (ABILIFY) 5 MG tablet Take 5 mg by mouth daily. 02/08/17   [provider]  ciprofloxacin (CIPRO) 500 MG tablet Take 1 tablet (500 mg total) by mouth 2 (two) times  daily. Patient not taking: Reported on 10/24/2017 03/10/17   Doristine Bosworth, MD  diphenhydrAMINE (BENADRYL) 25 mg capsule Take 25 mg by mouth every 6 (six) hours as needed for allergies.    [provider]  doxycycline (VIBRAMYCIN) 100 MG capsule Take 1 capsule (100 mg total) by mouth 2 (two) times daily. 07/26/18   Aashka Salomone, Tinnie Gens, PA-C  EPINEPHrine 0.3 mg/0.3 mL IJ SOAJ injection Inject 0.3 mg into the muscle as needed (allergic reaction).  01/02/17   [provider]  hydrOXYzine (ATARAX/VISTARIL) 50 MG tablet Take 25 mg by mouth daily as needed for itching.  10/06/16   [provider]  montelukast (SINGULAIR) 10 MG tablet Take 1 tablet (10 mg total) by mouth at bedtime. 03/10/17   Doristine Bosworth, MD  ondansetron (ZOFRAN ODT) 4 MG disintegrating tablet Take 1 tablet (4 mg total) by mouth every 8 (eight) hours as needed for nausea or vomiting. Patient not taking: Reported on 10/24/2017 09/06/17   Elpidio Anis, PA-C  prazosin (MINIPRESS) 1 MG capsule Take 1 mg by mouth at bedtime. 02/08/17   [provider]  VIIBRYD 20 MG TABS Take 20 mg by mouth daily. 01/31/17   [provider]  YASMIN 28 3-0.03 MG tablet Take 1 tablet by mouth daily. 10/12/16   [provider]    Family History History reviewed. No pertinent family history.  Social History Social History   Tobacco Use  .  Smoking status: Never Smoker  . Smokeless tobacco: Never Used  Substance Use Topics  . Alcohol use: No  . Drug use: Not on file     Allergies   Other and Peach flavor   Review of Systems Review of Systems  All other systems reviewed and are negative.  Physical Exam Updated Vital Signs BP 118/71   Pulse (!) 109   Temp 98.5 F (36.9 C) (Oral)   Resp 17   LMP 05/25/2018   SpO2 98%   Physical Exam  Constitutional: He is oriented to person, place, and time. He appears well-developed and well-nourished.  HENT:  Head: Normocephalic and atraumatic.    Swelling noted to the bilateral tonsils they are symmetrical, no exudate, minor erythema, no signs of PTA RPA, no pooling of secretions-tender bilateral cervical lymphadenopathy neck is supple full active range of motion-bilateral TMs normal  Eyes: Pupils are equal, round, and reactive to light. Conjunctivae are normal. Right eye exhibits no discharge. Left eye exhibits no discharge. No scleral icterus.  Neck: Normal range of motion. No JVD present. No tracheal deviation present.  Cardiovascular: Regular rhythm, normal heart sounds and intact distal pulses. Exam reveals no gallop and no friction rub.  No murmur heard. Pulmonary/Chest: Effort normal and breath sounds normal. No stridor. No respiratory distress. He has no wheezes. He has no rales. He exhibits no tenderness.  Neurological: He is alert and oriented to person, place, and time. Coordination normal.  Psychiatric: He has a normal mood and affect. His behavior is normal. Judgment and thought content normal.  Nursing note and vitals reviewed.    ED Treatments / Results  Labs (all labs ordered are listed, but only abnormal results are displayed) Labs Reviewed  GROUP A STREP BY PCR    EKG None  Radiology Dg Chest 2 View  Result Date: 07/26/2018 CLINICAL DATA:  Hemoptysis, chest congestion. History of asthma and upper respiratory infection. Nonsmoker. EXAM: CHEST - 2 VIEW COMPARISON:  None. FINDINGS: The lungs are adequately inflated without hyperinflation. There are no air bronchograms. The interstitial markings are mildly prominent in the right infrahilar region. There is no pleural effusion. The heart and pulmonary vascularity are normal. The mediastinum is normal in width. The bony thorax is unremarkable. IMPRESSION: Subsegmental atelectasis or early infiltrate in the right infrahilar region. No significant air trapping. Electronically Signed   By: David  [place] M.D.   On: 07/26/2018 14:14    Procedures Procedures (including  critical care time)  Medications Ordered in ED Medications  acetaminophen (TYLENOL) tablet 650 mg (650 mg Oral Given 07/26/18 1409)  dexamethasone (DECADRON) tablet 10 mg (10 mg Oral Given 07/26/18 1517)     Initial Impression / Assessment and Plan / ED Course  I have reviewed the triage vital signs and the nursing notes.  Pertinent labs & imaging results that were available during my care of the patient were reviewed by me and considered in my medical decision making (see chart for details).     Labs:   Imaging: DG chest 2 view  Consults:  Therapeutics:  Discharge Meds: Doxycycline  Assessment/Plan: 22 year old female presents today with respiratory infection.  Patient tachycardic here, afebrile with clear lung sounds reassuring oxygen saturation.  She does have chest x-ray with questionable atelectasis versus early infiltrate given the tachycardia and respiratory symptoms, coupled with the patient's history of asthma I do find it reasonable to start her on antibiotics for presumed community-acquired pneumonia.  Patient in no respiratory distress, stable for outpatient follow-up,  she is given strict return precautions and follow-up information.  She verbalized understanding and agreement to today's plan had no further questions or concerns at the time discharge.      Final Clinical Impressions(s) / ED Diagnoses   Final diagnoses:  Community acquired pneumonia, unspecified laterality    ED Discharge Orders         Ordered    doxycycline (VIBRAMYCIN) 100 MG capsule  2 times daily     07/26/18 1557    albuterol (PROVENTIL HFA;VENTOLIN HFA) 108 (90 Base) MCG/ACT inhaler  Every 4 hours PRN     07/26/18 1557           Eyvonne Mechanic, PA-C 07/26/18 1604    Little, Ambrose Finland, MD 07/27/18 1009

## 2018-07-27 ENCOUNTER — Encounter (HOSPITAL_COMMUNITY): Payer: Self-pay | Admitting: *Deleted

## 2018-07-27 ENCOUNTER — Emergency Department (HOSPITAL_COMMUNITY)
Admission: EM | Admit: 2018-07-27 | Discharge: 2018-07-27 | Disposition: A | Discharge status: Home / Self Care | Payer: 59 | Attending: Emergency Medicine | Admitting: Emergency Medicine

## 2018-07-27 ENCOUNTER — Emergency Department (HOSPITAL_COMMUNITY): Payer: 59

## 2018-07-27 ENCOUNTER — Other Ambulatory Visit: Payer: Self-pay

## 2018-07-27 DIAGNOSIS — R0781 Pleurodynia: Secondary | ICD-10-CM

## 2018-07-27 DIAGNOSIS — R091 Pleurisy: Secondary | ICD-10-CM | POA: Diagnosis not present

## 2018-07-27 LAB — CBC WITH DIFFERENTIAL/PLATELET
Abs Immature Granulocytes: 0.1 10*3/uL (ref 0.0–0.1)
BASOS ABS: 0 10*3/uL (ref 0.0–0.1)
BASOS PCT: 0 %
Eosinophils Absolute: 0 10*3/uL (ref 0.0–0.7)
Eosinophils Relative: 0 %
HCT: 42.6 % (ref 36.0–46.0)
HEMOGLOBIN: 14 g/dL (ref 12.0–15.0)
Immature Granulocytes: 1 %
Lymphocytes Relative: 5 %
Lymphs Abs: 0.7 10*3/uL (ref 0.7–4.0)
MCH: 28.3 pg (ref 26.0–34.0)
MCHC: 32.9 g/dL (ref 30.0–36.0)
MCV: 86.1 fL (ref 78.0–100.0)
Monocytes Absolute: 0.5 10*3/uL (ref 0.1–1.0)
Monocytes Relative: 4 %
NEUTROS ABS: 12.2 10*3/uL — AB (ref 1.7–7.7)
Neutrophils Relative %: 90 %
PLATELETS: 423 10*3/uL — AB (ref 150–400)
RBC: 4.95 MIL/uL (ref 3.87–5.11)
RDW: 13 % (ref 11.5–15.5)
WBC: 13.5 10*3/uL — AB (ref 4.0–10.5)

## 2018-07-27 LAB — COMPREHENSIVE METABOLIC PANEL
ALBUMIN: 3.8 g/dL (ref 3.5–5.0)
ALT: 16 U/L (ref 0–44)
ANION GAP: 13 (ref 5–15)
AST: 19 U/L (ref 15–41)
Alkaline Phosphatase: 90 U/L (ref 38–126)
BUN: 5 mg/dL — ABNORMAL LOW (ref 6–20)
CALCIUM: 9.5 mg/dL (ref 8.9–10.3)
CO2: 23 mmol/L (ref 22–32)
Chloride: 101 mmol/L (ref 98–111)
Creatinine, Ser: 0.75 mg/dL (ref 0.44–1.00)
Glucose, Bld: 111 mg/dL — ABNORMAL HIGH (ref 70–99)
Potassium: 4.2 mmol/L (ref 3.5–5.1)
Sodium: 137 mmol/L (ref 135–145)
TOTAL PROTEIN: 7.8 g/dL (ref 6.5–8.1)
Total Bilirubin: 0.8 mg/dL (ref 0.3–1.2)

## 2018-07-27 LAB — URINALYSIS, ROUTINE W REFLEX MICROSCOPIC
Bilirubin Urine: NEGATIVE
GLUCOSE, UA: NEGATIVE mg/dL
Hgb urine dipstick: NEGATIVE
KETONES UR: 20 mg/dL — AB
Leukocytes, UA: NEGATIVE
NITRITE: NEGATIVE
PROTEIN: NEGATIVE mg/dL
Specific Gravity, Urine: 1.02 (ref 1.005–1.030)
pH: 7 (ref 5.0–8.0)

## 2018-07-27 LAB — I-STAT BETA HCG BLOOD, ED (MC, WL, AP ONLY)

## 2018-07-27 MED ORDER — IBUPROFEN 600 MG PO TABS: 600.0000 mg | ORAL_TABLET | Freq: Three times a day (TID) | ORAL | 0 refills | Status: AC | PRN

## 2018-07-27 MED ORDER — KETOROLAC TROMETHAMINE 30 MG/ML IJ SOLN
30.0000 mg | Freq: Once | INTRAMUSCULAR | Status: AC
  Administered 2018-07-27: 30 mg via INTRAVENOUS
  Filled 2018-07-27: qty 1

## 2018-07-27 MED ORDER — ONDANSETRON 8 MG PO TBDP: 8.0000 mg | ORAL_TABLET | Freq: Three times a day (TID) | ORAL | 0 refills | Status: DC | PRN

## 2018-07-27 MED ORDER — IOPAMIDOL (ISOVUE-370) INJECTION 76%
INTRAVENOUS | Status: AC
  Administered 2018-07-27: 100 mL via INTRAVENOUS
  Filled 2018-07-27: qty 100

## 2018-07-27 MED ORDER — ONDANSETRON HCL 4 MG/2ML IJ SOLN
4.0000 mg | Freq: Once | INTRAMUSCULAR | Status: AC
  Administered 2018-07-27: 4 mg via INTRAVENOUS
  Filled 2018-07-27: qty 2

## 2018-07-27 MED ORDER — MORPHINE SULFATE (PF) 4 MG/ML IV SOLN
4.0000 mg | Freq: Once | INTRAVENOUS | Status: AC
  Administered 2018-07-27: 4 mg via INTRAVENOUS
  Filled 2018-07-27: qty 1

## 2018-07-27 NOTE — ED Triage Notes (Addendum)
Pt in c/o continued cough, congestion and pain from her pneumonia, pt was seen here yesterday and dx with pneumonia, pt returned due to n/v, pt has not started antibiotics

## 2018-07-27 NOTE — ED Provider Notes (Signed)
[facility] EMERGENCY DEPARTMENT Provider Note   CSN: [id number] Arrival date & time: 07/27/18  0701     History   Chief Complaint Chief Complaint  Patient presents with  . Pneumonia    HPI Tracie Stevens is a 22 y.o. adult.  HPI Patient reports mild cough congestion and a small amount of hemoptysis.  He was seen in the emergency department yesterday and diagnosed with pneumonia but has not yet started his antibiotics.  he returns the emergency department with ongoing symptoms today including a new complaint of anterior pleuritic chest pain.  he has had some nausea and vomiting today as well.  Patient symptoms are mild to moderate in severity.  No history of DVT or pulmonary embolism.  No unilateral leg swelling   Past Medical History:  Diagnosis Date  . Anemia   . Anxiety   . Bipolar 1 disorder (HCC)   . Depression     Patient Active Problem List   Diagnosis Date Noted  . Bipolar depression (HCC) 03/10/2017  . Anxiety and depression 03/10/2017  . Panic attacks 03/10/2017  . Iron deficiency anemia due to chronic blood loss 03/10/2017  . Moderate persistent asthma without complication 03/10/2017    History reviewed. No pertinent surgical history.   OB History   None      Home Medications    Prior to Admission medications   Medication Sig Start Date End Date Taking? Authorizing Provider  ADVAIR DISKUS 100-50 MCG/DOSE AEPB INHALE 1 PUFF INTO THE LUNGS TWICE DAILY 04/21/17   Doristine Bosworth, MD  albuterol (PROVENTIL HFA;VENTOLIN HFA) 108 (90 Base) MCG/ACT inhaler Inhale 2 puffs into the lungs every 4 (four) hours as needed for wheezing or shortness of breath. 07/26/18   Hedges, Tinnie Gens, PA-C  ARIPiprazole (ABILIFY) 5 MG tablet Take 5 mg by mouth daily. 02/08/17   [provider]  ciprofloxacin (CIPRO) 500 MG tablet Take 1 tablet (500 mg total) by mouth 2 (two) times daily. Patient not taking: Reported on 10/24/2017 03/10/17   Doristine Bosworth, MD   diphenhydrAMINE (BENADRYL) 25 mg capsule Take 25 mg by mouth every 6 (six) hours as needed for allergies.    [provider]  doxycycline (VIBRAMYCIN) 100 MG capsule Take 1 capsule (100 mg total) by mouth 2 (two) times daily. 07/26/18   Hedges, Tinnie Gens, PA-C  EPINEPHrine 0.3 mg/0.3 mL IJ SOAJ injection Inject 0.3 mg into the muscle as needed (allergic reaction).  01/02/17   [provider]  hydrOXYzine (ATARAX/VISTARIL) 50 MG tablet Take 25 mg by mouth daily as needed for itching.  10/06/16   [provider]  montelukast (SINGULAIR) 10 MG tablet Take 1 tablet (10 mg total) by mouth at bedtime. 03/10/17   Doristine Bosworth, MD  ondansetron (ZOFRAN ODT) 4 MG disintegrating tablet Take 1 tablet (4 mg total) by mouth every 8 (eight) hours as needed for nausea or vomiting. Patient not taking: Reported on 10/24/2017 09/06/17   Elpidio Anis, PA-C  prazosin (MINIPRESS) 1 MG capsule Take 1 mg by mouth at bedtime. 02/08/17   [provider]  VIIBRYD 20 MG TABS Take 20 mg by mouth daily. 01/31/17   [provider]  YASMIN 28 3-0.03 MG tablet Take 1 tablet by mouth daily. 10/12/16   [provider]    Family History History reviewed. No pertinent family history.  Social History Social History   Tobacco Use  . Smoking status: Never Smoker  . Smokeless tobacco: Never Used  Substance  Use Topics  . Alcohol use: No  . Drug use: Not on file     Allergies   Other and Peach flavor   Review of Systems Review of Systems  All other systems reviewed and are negative.    Physical Exam Updated Vital Signs BP 117/89   Pulse 85   Temp 98 F (36.7 C) (Oral)   Resp 16   Ht 5\' 4"  (1.626 m)   Wt 86.2 kg   SpO2 98%   BMI 32.61 kg/m   Physical Exam  Constitutional: He is oriented to person, place, and time. He appears well-developed and well-nourished.  HENT:  Head: Normocephalic and atraumatic.  Eyes: EOM are normal.  Neck: Normal range of  motion.  Cardiovascular: Normal rate, regular rhythm, normal heart sounds and intact distal pulses.  Pulmonary/Chest: Effort normal and breath sounds normal. No respiratory distress.  Abdominal: Soft. He exhibits no distension. There is no tenderness.  Musculoskeletal: Normal range of motion.  Neurological: He is alert and oriented to person, place, and time.  Skin: Skin is warm and dry.  Psychiatric: He has a normal mood and affect. Judgment normal.  Nursing note and vitals reviewed.    ED Treatments / Results  Labs (all labs ordered are listed, but only abnormal results are displayed) Labs Reviewed  COMPREHENSIVE METABOLIC PANEL - Abnormal; Notable for the following components:      Result Value   Glucose, Bld 111 (*)    BUN 5 (*)    All other components within normal limits  CBC WITH DIFFERENTIAL/PLATELET - Abnormal; Notable for the following components:   WBC 13.5 (*)    Platelets 423 (*)    Neutro Abs 12.2 (*)    All other components within normal limits  URINALYSIS, ROUTINE W REFLEX MICROSCOPIC  I-STAT BETA HCG BLOOD, ED ([facility], [facility], [facility] ONLY)    EKG None  Radiology Dg Chest 2 View  Result Date: 07/26/2018 CLINICAL DATA:  Hemoptysis, chest congestion. History of asthma and upper respiratory infection. Nonsmoker. EXAM: CHEST - 2 VIEW COMPARISON:  None. FINDINGS: The lungs are adequately inflated without hyperinflation. There are no air bronchograms. The interstitial markings are mildly prominent in the right infrahilar region. There is no pleural effusion. The heart and pulmonary vascularity are normal. The mediastinum is normal in width. The bony thorax is unremarkable. IMPRESSION: Subsegmental atelectasis or early infiltrate in the right infrahilar region. No significant air trapping. Electronically Signed   By: David  [place] M.D.   On: 07/26/2018 14:14   Ct Angio Chest Pe W And/or Wo Contrast  Result Date: 07/27/2018 CLINICAL DATA:  Pt in c/o continued cough, congestion and  pain from her pneumonia, pt was seen here yesterday and dx with pneumonia, pt returned due to n/v, pt has not started antibiotics EXAM: CT ANGIOGRAPHY CHEST WITH CONTRAST TECHNIQUE: Multidetector CT imaging of the chest was performed using the standard protocol during bolus administration of intravenous contrast. Multiplanar CT image reconstructions and MIPs were obtained to evaluate the vascular anatomy. CONTRAST:  100mL ISOVUE-370 IOPAMIDOL (ISOVUE-370) INJECTION 76% COMPARISON:  None. FINDINGS: Cardiovascular: Heart size normal. No pericardial effusion. Satisfactory opacification of pulmonary arteries noted, and there is no evidence of pulmonary emboli. Incomplete opacification of the thoracic aorta. No evidence of aneurysm or suggestion of dissection. No atheromatous plaque. Mediastinum/Nodes: No hilar or mediastinal adenopathy. Lungs/Pleura: No pleural effusion. No pneumothorax. Lungs are clear. Musculoskeletal: No chest wall abnormality. No acute or significant osseous findings. Upper abdomen: No acute findings. Review of  the MIP images confirms the above findings. IMPRESSION: 1. Negative.  No evidence of pulmonary embolism or pneumonia. Electronically Signed   By: Corlis Leak M.D.   On: 07/27/2018 08:58    Procedures Procedures (including critical care time)  Medications Ordered in ED Medications  ketorolac (TORADOL) 30 MG/ML injection 30 mg (30 mg Intravenous Given 07/27/18 0816)  morphine 4 MG/ML injection 4 mg (4 mg Intravenous Given 07/27/18 0816)  ondansetron (ZOFRAN) injection 4 mg (4 mg Intravenous Given 07/27/18 0815)  iopamidol (ISOVUE-370) 76 % injection (100 mLs Intravenous Contrast Given 07/27/18 0845)     Initial Impression / Assessment and Plan / ED Course  I have reviewed the triage vital signs and the nursing notes.  Pertinent labs & imaging results that were available during my care of the patient were reviewed by me and considered in my medical decision making (see chart for  details).     Overall well-appearing.  CT angios performed today which demonstrates no evidence of pneumonia and no evidence of pulmonary embolism.  This is likely pleurisy.  Discharged home with anti-inflammatories and symptomatic management.  Primary care follow-up.  Patient encouraged to return to the ER for new or worsening symptoms.  I do not think she needs antibiotics.  Final Clinical Impressions(s) / ED Diagnoses   Final diagnoses:  None    ED Discharge Orders    None       Azalia Bilis, MD 07/27/18 [phone]

## 2018-07-27 NOTE — ED Notes (Signed)
D/c reviewed with patient 

## 2018-07-27 NOTE — ED Notes (Signed)
Patient transported to CT 

## 2018-07-28 ENCOUNTER — Encounter: Payer: Self-pay | Admitting: Osteopathic Medicine

## 2018-07-28 ENCOUNTER — Ambulatory Visit (INDEPENDENT_AMBULATORY_CARE_PROVIDER_SITE_OTHER): Payer: 59 | Admitting: Osteopathic Medicine

## 2018-07-28 VITALS — BP 102/63 | HR 92 | Temp 98.7°F | Resp 16 | Ht 62.5 in | Wt 188.0 lb

## 2018-07-28 DIAGNOSIS — J4541 Moderate persistent asthma with (acute) exacerbation: Secondary | ICD-10-CM

## 2018-07-28 MED ORDER — GUAIFENESIN-CODEINE 100-10 MG/5ML PO SYRP: 5.0000 mL | ORAL_SOLUTION | Freq: Four times a day (QID) | ORAL | 0 refills | Status: DC | PRN

## 2018-07-28 MED ORDER — LIDOCAINE VISCOUS HCL 2 % MT SOLN: 5.0000 mL | OROMUCOSAL | 0 refills | Status: AC | PRN

## 2018-07-28 MED ORDER — PREDNISONE 10 MG (48) PO TBPK: ORAL_TABLET | Freq: Every day | ORAL | 0 refills | Status: AC

## 2018-07-28 MED ORDER — GUAIFENESIN-CODEINE 100-10 MG/5ML PO SYRP: 5.0000 mL | ORAL_SOLUTION | Freq: Four times a day (QID) | ORAL | 0 refills | Status: AC | PRN

## 2018-07-28 NOTE — Progress Notes (Signed)
HPI: Tracie Stevens is a 22 y.o. adult who  has a past medical history of Anemia, Anxiety, Bipolar 1 disorder ([place]), and Depression.  he presents to [place] at [facility] today, 07/28/18,  for chief complaint of: SOB  Records reviewed: Patient has had 2 emergency room visits in the past 3 days, 07/26/2018 there was some concern on chest x-ray for possible community-acquired pneumonia, she return to the emergency room the next day, 07/27/2018, yesterday, complaints of worsening chest pain and CT was negative for PE or other lung pathology.   She presents today complaining of bad sore throat, chest pain on deep inspiration, coughing up mucus.  She is an asthmatic.  Flovent once daily.  She has not been using her albuterol inhaler stating "it makes me feel like a panic attack"    Past medical, surgical, social and family history reviewed:  Patient Active Problem List   Diagnosis Date Noted  . Bipolar depression ([place]) 03/10/2017  . Anxiety and depression 03/10/2017  . Panic attacks 03/10/2017  . Iron deficiency anemia due to chronic blood loss 03/10/2017  . Moderate persistent asthma without complication 02/33/4356    No past surgical history on file.  Social History   Tobacco Use  . Smoking status: Never Smoker  . Smokeless tobacco: Never Used  Substance Use Topics  . Alcohol use: No    No family history on file.   Current medication list and allergy/intolerance information reviewed:    Current Outpatient Medications  Medication Sig Dispense Refill  . ADVAIR DISKUS 100-50 MCG/DOSE AEPB INHALE 1 PUFF INTO THE LUNGS TWICE DAILY 1 each 2  . albuterol (PROVENTIL HFA;VENTOLIN HFA) 108 (90 Base) MCG/ACT inhaler Inhale 2 puffs into the lungs every 4 (four) hours as needed for wheezing or shortness of breath. 1 Inhaler 2  . ARIPiprazole (ABILIFY) 5 MG tablet Take 5 mg by mouth daily.    . diphenhydrAMINE (BENADRYL) 25 mg capsule Take 25 mg by mouth every 6 (six) hours as  needed for allergies.    Marland Kitchen EPINEPHrine 0.3 mg/0.3 mL IJ SOAJ injection Inject 0.3 mg into the muscle as needed (allergic reaction).     . hydrOXYzine (ATARAX/VISTARIL) 50 MG tablet Take 25 mg by mouth daily as needed for itching.     Marland Kitchen ibuprofen (ADVIL,MOTRIN) 600 MG tablet Take 1 tablet (600 mg total) by mouth every 8 (eight) hours as needed. 15 tablet 0  . montelukast (SINGULAIR) 10 MG tablet Take 1 tablet (10 mg total) by mouth at bedtime. 30 tablet 3  . ondansetron (ZOFRAN ODT) 8 MG disintegrating tablet Take 1 tablet (8 mg total) by mouth every 8 (eight) hours as needed for nausea or vomiting. 10 tablet 0  . prazosin (MINIPRESS) 1 MG capsule Take 1 mg by mouth at bedtime.    Marland Kitchen VIIBRYD 20 MG TABS Take 20 mg by mouth daily.    Marland Kitchen YASMIN 28 3-0.03 MG tablet Take 1 tablet by mouth daily.     No current facility-administered medications for this visit.     Allergies  Allergen Reactions  . Other Anaphylaxis and Swelling    Nuts, shellfish, sesame seeds  . Peach Flavor Swelling      Review of Systems:  Constitutional:  No  fever, +chills, No recent illness, No unintentional weight changes. +significant fatigue.   HEENT: No  headache, no vision change, no hearing change, +sore throat, +sinus pressure  Cardiac: +chest pain, No  pressure, No palpitations  Respiratory:  No  shortness of breath. No  Cough  Gastrointestinal: No  abdominal pain, No  nausea, No  vomiting,  No  blood in stool, No  diarrhea, No  constipation    Musculoskeletal: +generalized myalgia/arthralgia  Skin: No  Rash,  Neurologic: No focal  weakness, No  dizziness   Exam:  BP 102/63   Pulse 92   Temp 98.7 F (37.1 C) (Oral)   Resp 16   Ht 5' 2.5" (1.588 m)   Wt 188 lb (85.3 kg)   SpO2 95%   BMI 33.84 kg/m   Constitutional: VS see above. General Appearance: alert, well-developed, well-nourished, NAD  Eyes: Normal lids and conjunctive, non-icteric sclera  Ears, Nose, Mouth, Throat: MMM, Normal  external inspection ears/nares/mouth/lips/gums. TM normal bilaterally. Pharynx/tonsils +erythema, no exudate. Nasal mucosa normal.   Neck: No masses, trachea midline. No thyroid enlargement. No tenderness/mass appreciated. No lymphadenopathy  Respiratory: Poor respiratory effort unless pushed to take a deep breath. Very faint scattered wheeze, no rhonchi, no rales  Cardiovascular: S1/S2 normal, no murmur, no rub/gallop auscultated. RRR.   Musculoskeletal: Gait normal. No clubbing/cyanosis of digits.   Neurological: Normal balance/coordination.   Skin: warm, dry, intact.    Psychiatric: Flat mood and affect.   Results for orders placed or performed during the hospital encounter of 07/27/18 (from the past 72 hour(s))  Comprehensive metabolic panel     Status: Abnormal   Collection Time: 07/27/18  7:10 AM  Result Value Ref Range   Sodium 137 135 - 145 mmol/L   Potassium 4.2 3.5 - 5.1 mmol/L   Chloride 101 98 - 111 mmol/L   CO2 23 22 - 32 mmol/L   Glucose, Bld 111 (H) 70 - 99 mg/dL   BUN 5 (L) 6 - 20 mg/dL   Creatinine, Ser 0.75 0.44 - 1.00 mg/dL   Calcium 9.5 8.9 - 10.3 mg/dL   Total Protein 7.8 6.5 - 8.1 g/dL   Albumin 3.8 3.5 - 5.0 g/dL   AST 19 15 - 41 U/L   ALT 16 0 - 44 U/L   Alkaline Phosphatase 90 38 - 126 U/L   Total Bilirubin 0.8 0.3 - 1.2 mg/dL   GFR calc non Af Amer >60 >60 mL/min   GFR calc Af Amer >60 >60 mL/min    Comment: (NOTE) The eGFR has been calculated using the CKD EPI equation. This calculation has not been validated in all clinical situations. eGFR's persistently <60 mL/min signify possible Chronic Kidney Disease.    Anion gap 13 5 - 15    Comment: Performed at [facility] [address] [place], [place] [address]  CBC with Differential     Status: Abnormal   Collection Time: 07/27/18  7:10 AM  Result Value Ref Range   WBC 13.5 (H) 4.0 - 10.5 K/uL   RBC 4.95 3.87 - 5.11 MIL/uL   Hemoglobin 14.0 12.0 - 15.0 g/dL   HCT 42.6 36.0 - 46.0 %    MCV 86.1 78.0 - 100.0 fL   MCH 28.3 26.0 - 34.0 pg   MCHC 32.9 30.0 - 36.0 g/dL   RDW 13.0 11.5 - 15.5 %   Platelets 423 (H) 150 - 400 K/uL   Neutrophils Relative % 90 %   Neutro Abs 12.2 (H) 1.7 - 7.7 K/uL   Lymphocytes Relative 5 %   Lymphs Abs 0.7 0.7 - 4.0 K/uL   Monocytes Relative 4 %   Monocytes Absolute 0.5 0.1 - 1.0 K/uL   Eosinophils Relative 0 %  Eosinophils Absolute 0.0 0.0 - 0.7 K/uL   Basophils Relative 0 %   Basophils Absolute 0.0 0.0 - 0.1 K/uL   Immature Granulocytes 1 %   Abs Immature Granulocytes 0.1 0.0 - 0.1 K/uL    Comment: Performed at [facility] [address], [place], [place] [address]  Urinalysis, Routine w reflex microscopic     Status: Abnormal   Collection Time: 07/27/18  7:10 AM  Result Value Ref Range   Color, Urine YELLOW YELLOW   APPearance CLEAR CLEAR   Specific Gravity, Urine 1.020 1.005 - 1.030   pH 7.0 5.0 - 8.0   Glucose, UA NEGATIVE NEGATIVE mg/dL   Hgb urine dipstick NEGATIVE NEGATIVE   Bilirubin Urine NEGATIVE NEGATIVE   Ketones, ur 20 (A) NEGATIVE mg/dL   Protein, ur NEGATIVE NEGATIVE mg/dL   Nitrite NEGATIVE NEGATIVE   Leukocytes, UA NEGATIVE NEGATIVE    Comment: Performed at [facility] [address], [place], [place] [address]  I-Stat beta hCG blood, ED     Status: None   Collection Time: 07/27/18  7:40 AM  Result Value Ref Range   I-stat hCG, quantitative <5.0 <5 mIU/mL   Comment 3            Comment:   GEST. AGE      CONC.  (mIU/mL)   <=1 WEEK        5 - 50     2 WEEKS       50 - 500     3 WEEKS       100 - 10,000     4 WEEKS     1,000 - 30,000        FEMALE AND NON-PREGNANT FEMALE:     LESS THAN 5 mIU/mL     Dg Chest 2 View  Result Date: 07/26/2018 CLINICAL DATA:  Hemoptysis, chest congestion. History of asthma and upper respiratory infection. Nonsmoker. EXAM: CHEST - 2 VIEW COMPARISON:  None. FINDINGS: The lungs are adequately inflated without hyperinflation. There are no air bronchograms. The  interstitial markings are mildly prominent in the right infrahilar region. There is no pleural effusion. The heart and pulmonary vascularity are normal. The mediastinum is normal in width. The bony thorax is unremarkable. IMPRESSION: Subsegmental atelectasis or early infiltrate in the right infrahilar region. No significant air trapping. Electronically Signed   By: David  Martinique M.D.   On: 07/26/2018 14:14   Ct Angio Chest Pe W And/or Wo Contrast  Result Date: 07/27/2018 CLINICAL DATA:  Pt in c/o continued cough, congestion and pain from her pneumonia, pt was seen here yesterday and dx with pneumonia, pt returned due to n/v, pt has not started antibiotics EXAM: CT ANGIOGRAPHY CHEST WITH CONTRAST TECHNIQUE: Multidetector CT imaging of the chest was performed using the standard protocol during bolus administration of intravenous contrast. Multiplanar CT image reconstructions and MIPs were obtained to evaluate the vascular anatomy. CONTRAST:  170m ISOVUE-370 IOPAMIDOL (ISOVUE-370) INJECTION 76% COMPARISON:  None. FINDINGS: Cardiovascular: Heart size normal. No pericardial effusion. Satisfactory opacification of pulmonary arteries noted, and there is no evidence of pulmonary emboli. Incomplete opacification of the thoracic aorta. No evidence of aneurysm or suggestion of dissection. No atheromatous plaque. Mediastinum/Nodes: No hilar or mediastinal adenopathy. Lungs/Pleura: No pleural effusion. No pneumothorax. Lungs are clear. Musculoskeletal: No chest wall abnormality. No acute or significant osseous findings. Upper abdomen: No acute findings. Review of the MIP images confirms the above findings. IMPRESSION: 1. Negative.  No evidence of  pulmonary embolism or pneumonia. Electronically Signed   By: Lucrezia Europe M.D.   On: 07/27/2018 08:58     ASSESSMENT/PLAN:   Moderate persistent asthma with acute exacerbation   Meds ordered this encounter  Medications  . predniSONE (STERAPRED UNI-PAK 48 TAB) 10 MG (48)  TBPK tablet    Sig: Take by mouth daily. 12-Day taper, po    Dispense:  48 tablet    Refill:  0  . DISCONTD: guaiFENesin-codeine (ROBITUSSIN AC) 100-10 MG/5ML syrup    Sig: Take 5 mLs by mouth 4 (four) times daily as needed for cough.    Dispense:  118 mL    Refill:  0  . lidocaine (XYLOCAINE) 2 % solution    Sig: Use as directed 5-10 mLs in the mouth or throat every 3 (three) hours as needed (sore throat).    Dispense:  100 mL    Refill:  0  . guaiFENesin-codeine (ROBITUSSIN AC) 100-10 MG/5ML syrup    Sig: Take 5 mLs by mouth 4 (four) times daily as needed for cough.    Dispense:  118 mL    Refill:  0    Patient Instructions   Plan: I believe your symptoms are due to a viral respiratory infection which has triggered an asthma exacerbation.  I do not believe antibiotics are necessary at this point, but I think we can control your symptoms much better with the following:  Please use your albuterol at least 2-3 times per day to help open up the lungs and relieve chest pain with breathing, this will also improve lung function and keep you from experiencing severe fatigue  Would recommend Flovent use twice daily   I wrote a prescription for a longer steroid taper  I wrote a prescription for cough medication to use as needed  I wrote a prescription for lidocaine syrup to help numb the back of the throat, the easiest way to use this is to squeeze some of the medicine from the bottle into a cup, pull it into a syringe and administer it toward the back of the throat, rather than doing it with a spoon which can get the medicine all over your mouth and not taste too good Please follow-up with your primary care provider in the next week to check and make sure you are medical attention sooner if needed      If you have lab work done today you will be contacted with your lab results within the next 2 weeks.  If you have not heard from [place] then please contact us. The fastest way to get your  results is to register for My Chart.   IF you received an x-ray today, you will receive an invoice from [facility]. Please contact [facility] at [phone] with questions or concerns regarding your invoice.   IF you received labwork today, you will receive an invoice from [facility]. Please contact [facility] at [phone] with questions or concerns regarding your invoice.   Our billing staff will not be able to assist you with questions regarding bills from these companies.  You will be contacted with the lab results as soon as they are available. The fastest way to get your results is to activate your My Chart account. Instructions are located on the last page of this paperwork. If you have not heard from [place] regarding the results in 2 weeks, please contact this office.           Visit summary with medication list and  pertinent instructions was printed for patient to review. All questions at time of visit were answered - patient instructed to contact office with any additional concerns. ER/RTC precautions were reviewed with the patient.   Follow-up plan: Return if symptoms worsen or fail to improve.    Please note: voice recognition software was used to produce this document, and typos may escape review. Please contact Dr. Sheppard Coil for any needed clarifications.

## 2018-07-28 NOTE — Patient Instructions (Addendum)
Plan: I believe your symptoms are due to a viral respiratory infection which has triggered an asthma exacerbation.  I do not believe antibiotics are necessary at this point, but I think we can control your symptoms much better with the following:  Please use your albuterol at least 2-3 times per day to help open up the lungs and relieve chest pain with breathing, this will also improve lung function and keep you from experiencing severe fatigue  Would recommend Flovent use twice daily   I wrote a prescription for a longer steroid taper  I wrote a prescription for cough medication to use as needed  I wrote a prescription for lidocaine syrup to help numb the back of the throat, the easiest way to use this is to squeeze some of the medicine from the bottle into a cup, pull it into a syringe and administer it toward the back of the throat, rather than doing it with a spoon which can get the medicine all over your mouth and not taste too good Please follow-up with your primary care provider in the next week to check and make sure you are medical attention sooner if needed      If you have lab work done today you will be contacted with your lab results within the next 2 weeks.  If you have not heard from us then please contact us. The fastest way to get your results is to register for My Chart.   IF you received an x-ray today, you will receive an invoice from [facility]. Please contact [facility] at [phone] with questions or concerns regarding your invoice.   IF you received labwork today, you will receive an invoice from [facility]. Please contact [facility] at [phone] with questions or concerns regarding your invoice.   Our billing staff will not be able to assist you with questions regarding bills from these companies.  You will be contacted with the lab results as soon as they are available. The fastest way to get your results is to activate your My Chart account.  Instructions are located on the last page of this paperwork. If you have not heard from us regarding the results in 2 weeks, please contact this office.

## 2018-08-01 ENCOUNTER — Ambulatory Visit: Payer: 59 | Admitting: Physician Assistant

## 2018-08-09 ENCOUNTER — Ambulatory Visit: Payer: 59 | Admitting: Physician Assistant

## 2019-12-11 IMAGING — CR DG CHEST 2V
2 series · 2 of 2 positions shown · non-contrast
Comparison: None.

CLINICAL DATA: Hemoptysis, chest congestion. History of asthma and
upper respiratory infection. Nonsmoker.

EXAM:
CHEST - 2 VIEW

[chest pa]
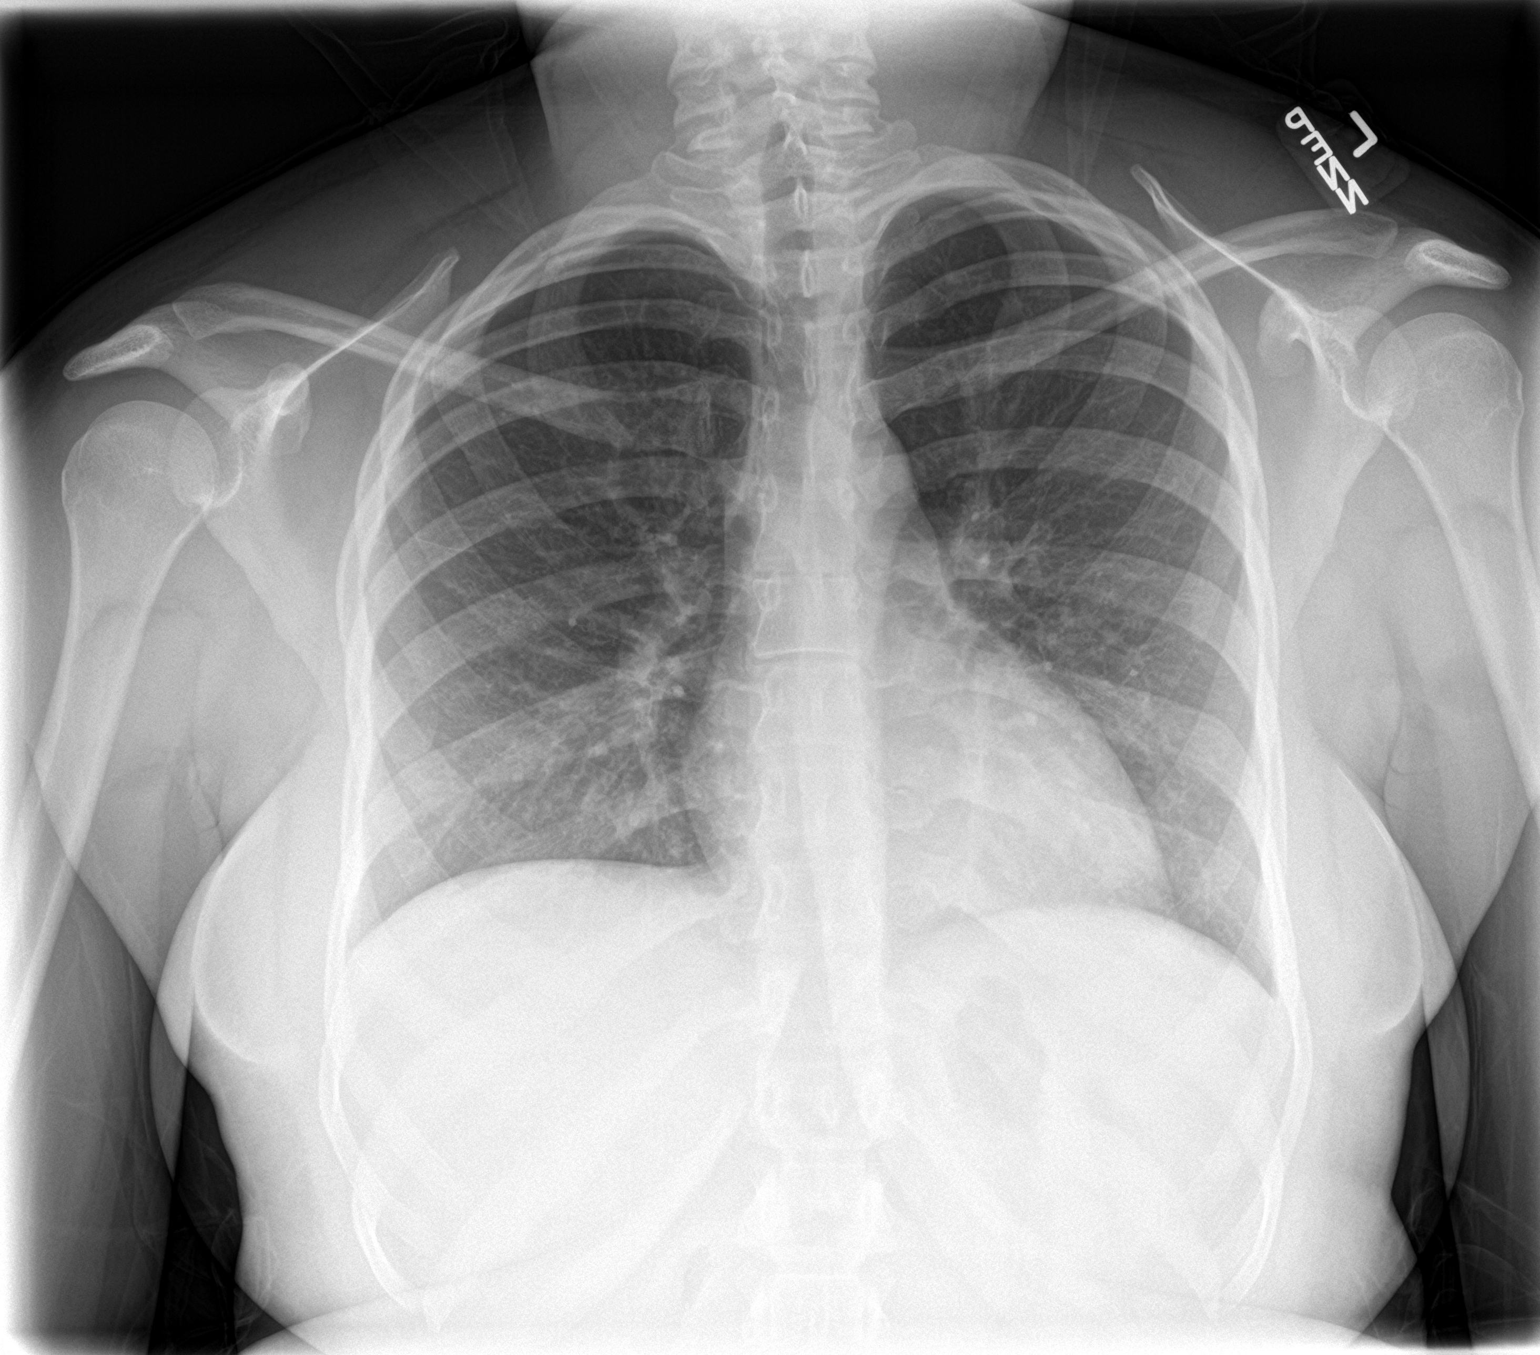

[chest lat]
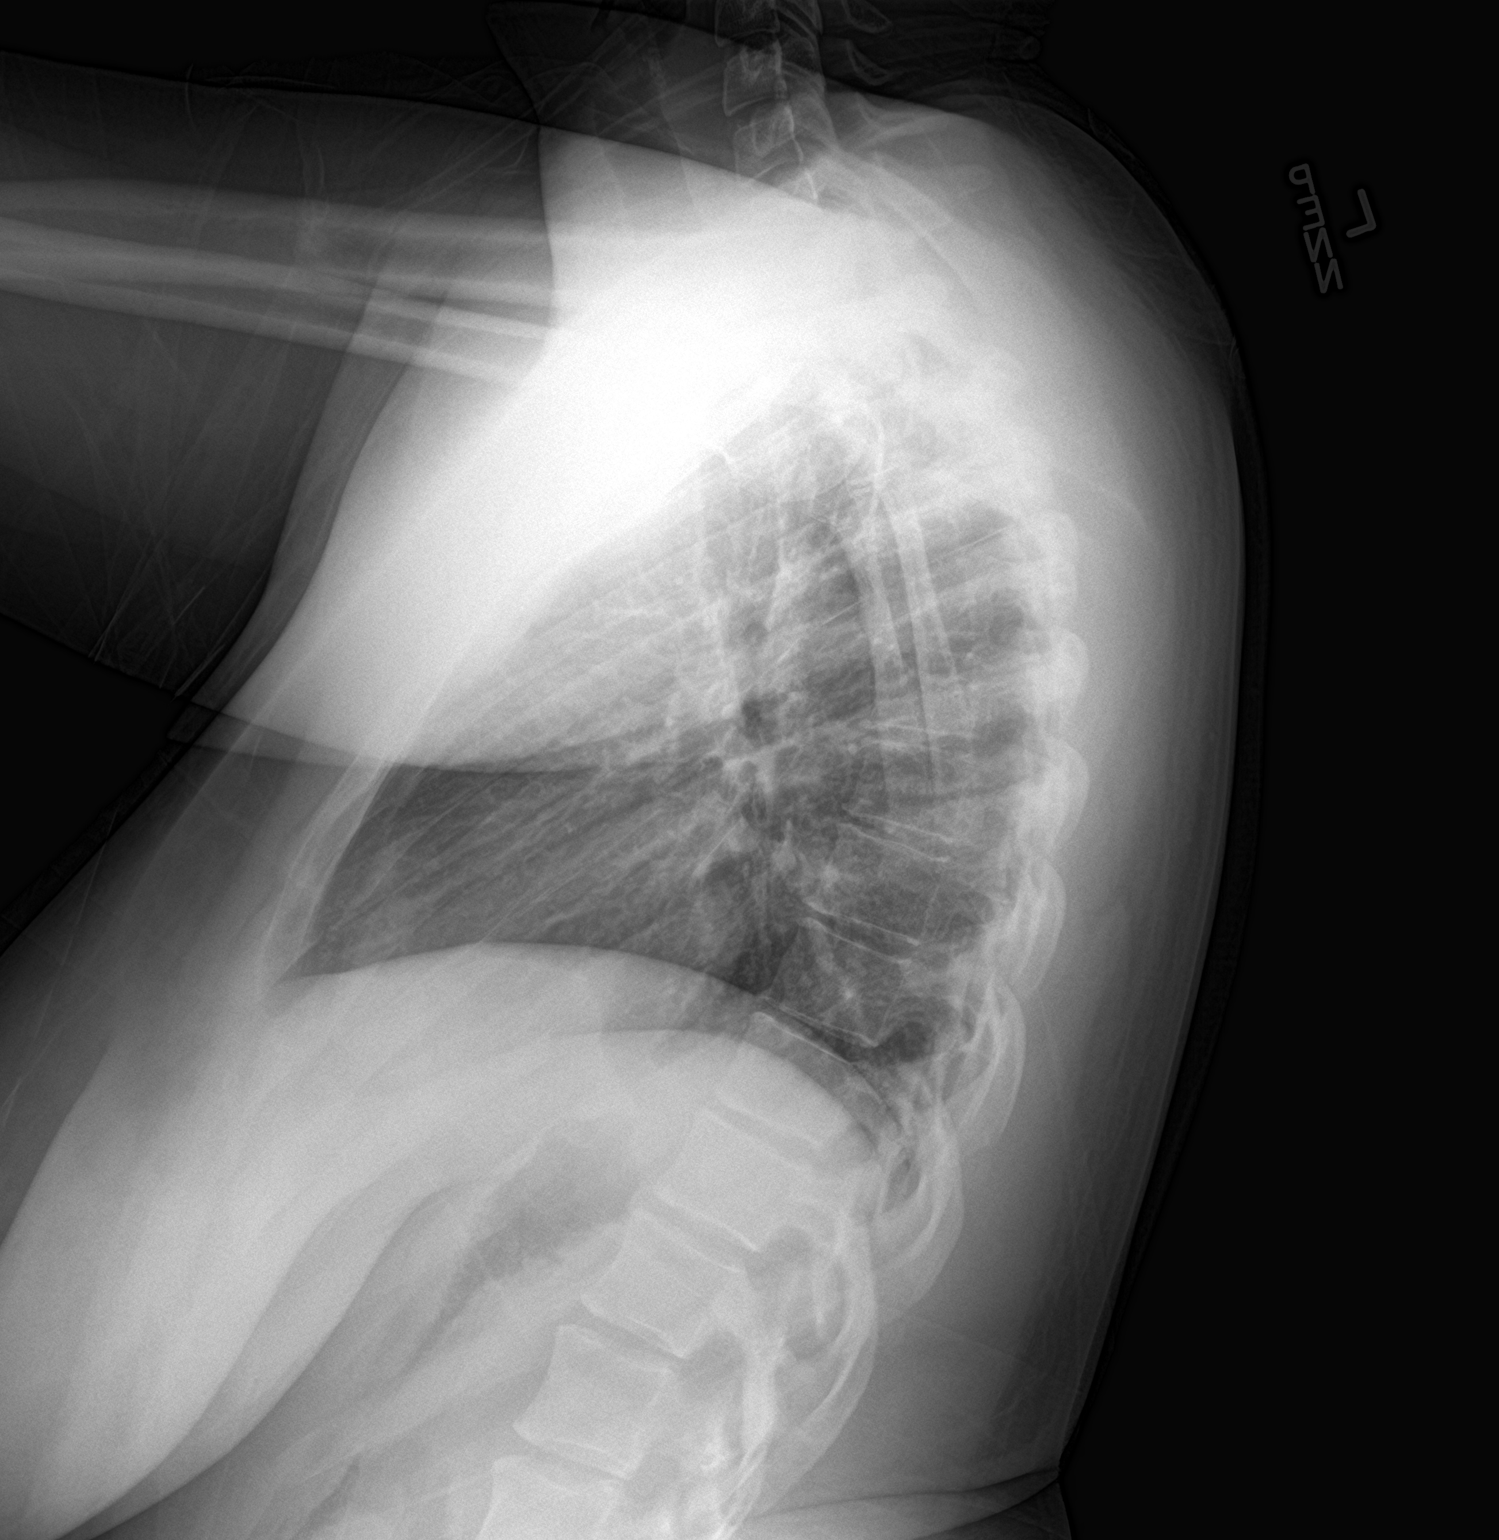

[2 of 2 positions shown; findings below may reference images not displayed]

FINDINGS: The lungs are adequately inflated without hyperinflation. There are
no air bronchograms. The interstitial markings are mildly prominent
in the right infrahilar region. There is no pleural effusion. The
heart and pulmonary vascularity are normal. The mediastinum is
normal in width. The bony thorax is unremarkable.
IMPRESSION: Subsegmental atelectasis or early infiltrate in the right infrahilar
region. No significant air trapping.

## 2019-12-12 IMAGING — CT CT ANGIO CHEST
2 of 8 series · 16 of 36 positions shown · IV contrast (iopamidol)
Comparison: None.

CLINICAL DATA: Pt in c/o continued cough, congestion and pain from
her pneumonia, pt was seen here yesterday and dx with pneumonia, pt
returned due to n/v, pt has not started antibiotics

EXAM:
CT ANGIOGRAPHY CHEST WITH CONTRAST
TECHNIQUE: Multidetector CT imaging of the chest was performed using the
standard protocol during bolus administration of intravenous
contrast. Multiplanar CT image reconstructions and MIPs were
obtained to evaluate the vascular anatomy.
CONTRAST:  100mL M9ALJT-4L2 IOPAMIDOL (M9ALJT-4L2) INJECTION 76%

[Series 10: pe thins · axial · 0.54mm/px · z∈[+1161,+1323]mm · 15 of 266 slices shown]
[im 17/266  lung]
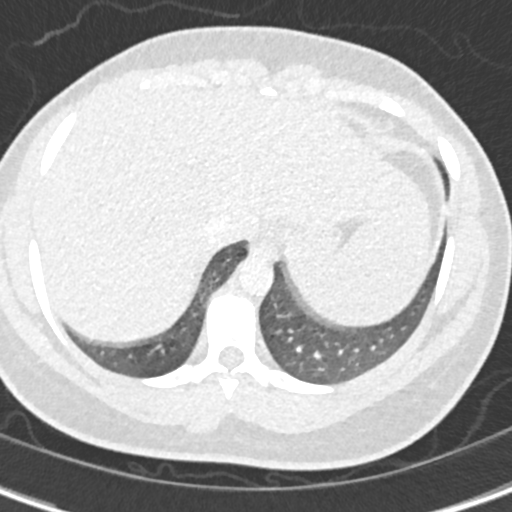
[im 34/266  mediastinal]
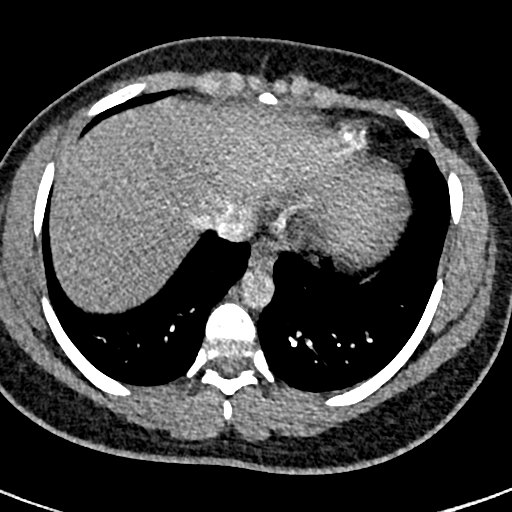
[im 50/266  lung]
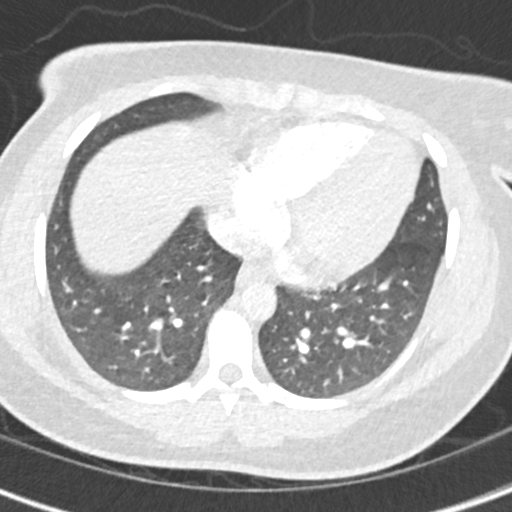
[im 67/266  mediastinal]
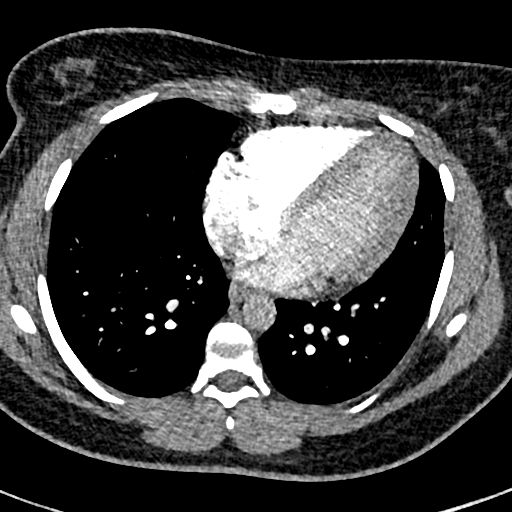
[im 83/266  lung]
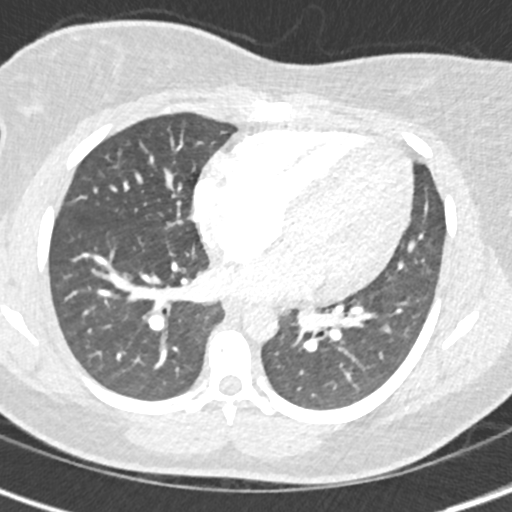
[im 100/266  mediastinal]
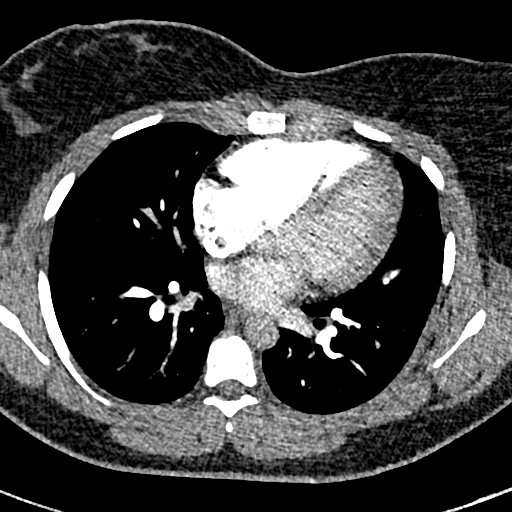
[im 116/266  lung]
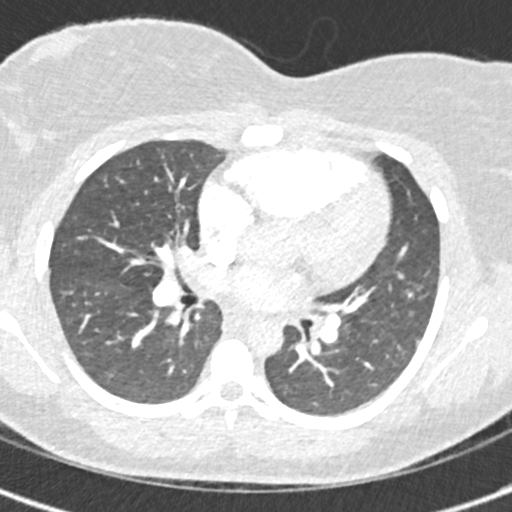
[im 133/266  mediastinal]
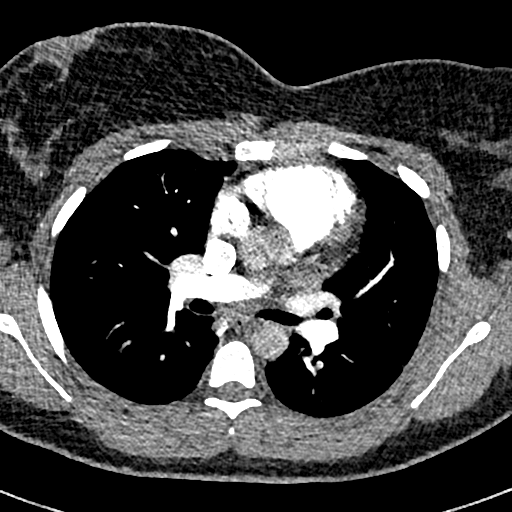
[im 150/266  lung]
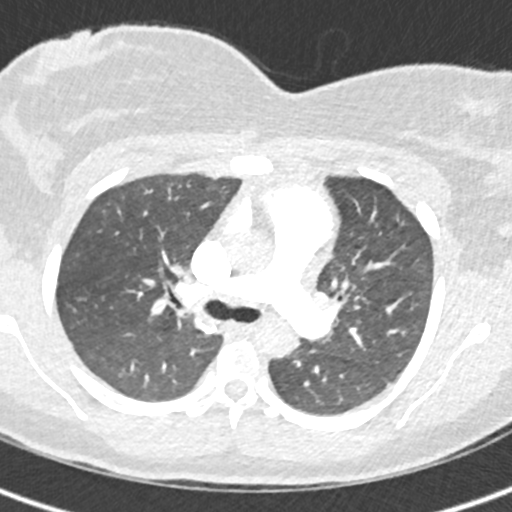
[im 166/266  mediastinal]
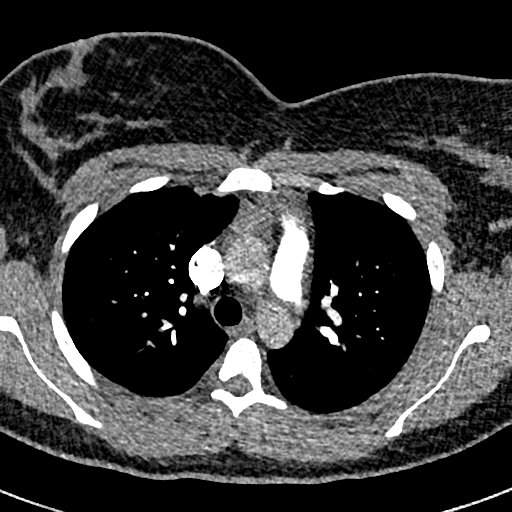
[im 183/266  lung]
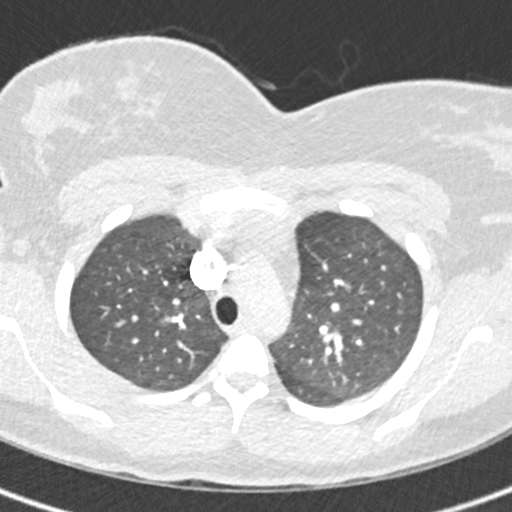
[im 199/266  mediastinal]
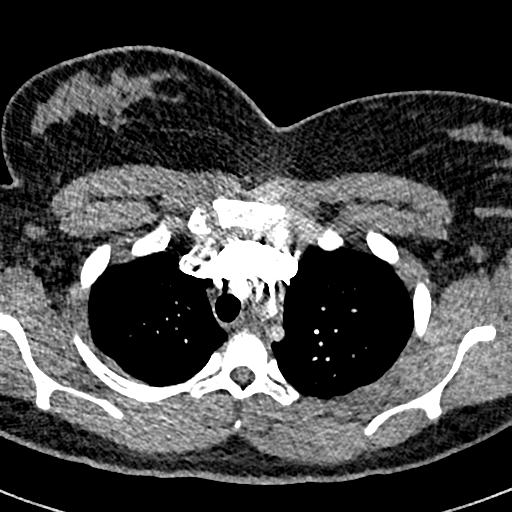
[im 216/266  lung]
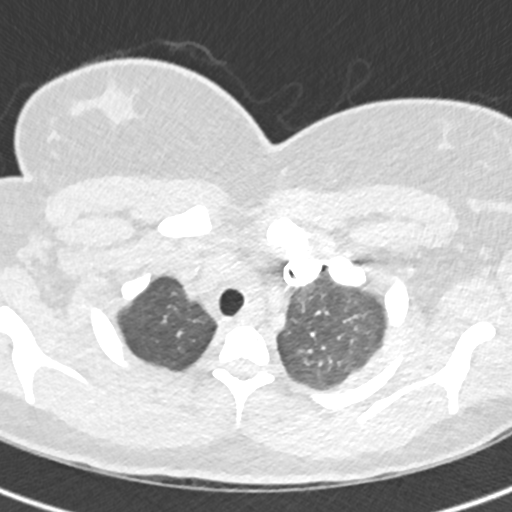
[im 232/266  mediastinal]
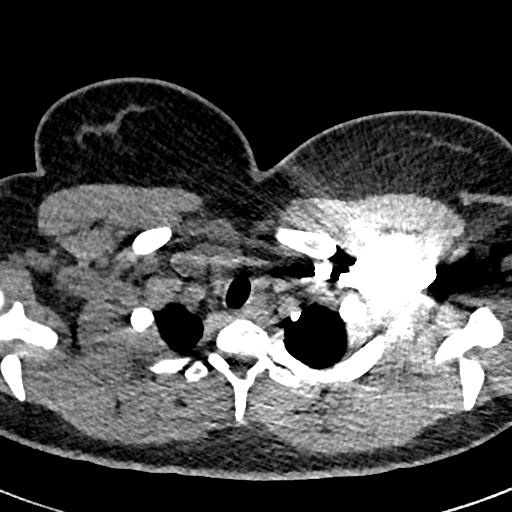
[im 249/266  lung]
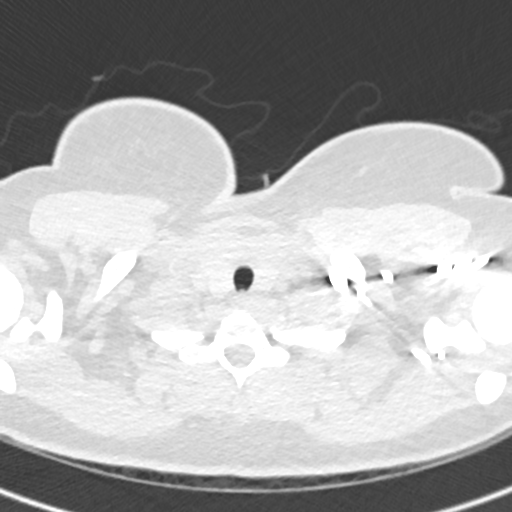

[Series 12: pe 2mm cor · coronal · 0.39mm/px · 1 of 117 slices shown]
[im 59/117  mediastinal]
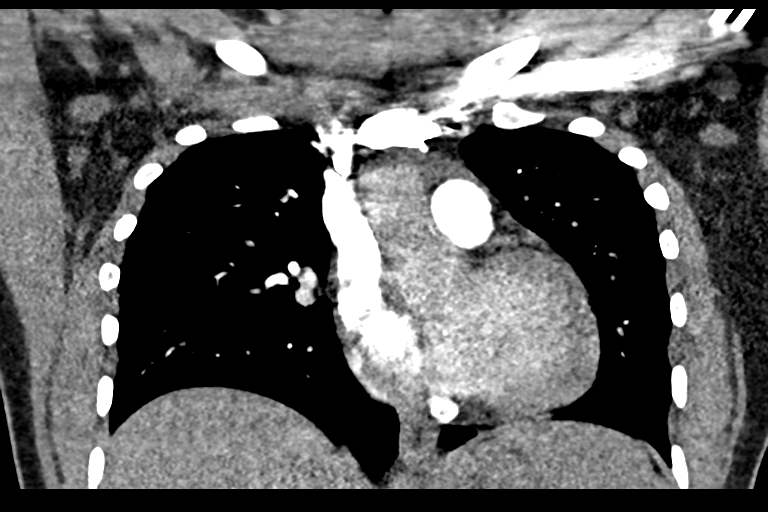

[16 of 36 positions shown; findings below may reference images not displayed]

FINDINGS: Cardiovascular: Heart size normal. No pericardial effusion.
Satisfactory opacification of pulmonary arteries noted, and there is
no evidence of pulmonary emboli. Incomplete opacification of the
thoracic aorta. No evidence of aneurysm or suggestion of dissection.
No atheromatous plaque.

Mediastinum/Nodes: No hilar or mediastinal adenopathy.

Lungs/Pleura: No pleural effusion. No pneumothorax. Lungs are clear.

Musculoskeletal: No chest wall abnormality. No acute or significant
osseous findings.

Upper abdomen: No acute findings.

Review of the MIP images confirms the above findings.
IMPRESSION: 1. Negative.  No evidence of pulmonary embolism or pneumonia.
